# Patient Record
Sex: Female | Born: 1975 | Race: White | Hispanic: No | Marital: Married | Smoking: Former smoker
Health system: Southern US, Community
[De-identification: ages and names within clinical notes are randomized; demographics above are authoritative.]

## PROBLEM LIST (undated history)

## (undated) DIAGNOSIS — Z9289 Personal history of other medical treatment: Secondary | ICD-10-CM

## (undated) DIAGNOSIS — G4700 Insomnia, unspecified: Secondary | ICD-10-CM

## (undated) DIAGNOSIS — G43909 Migraine, unspecified, not intractable, without status migrainosus: Secondary | ICD-10-CM

## (undated) DIAGNOSIS — F329 Major depressive disorder, single episode, unspecified: Secondary | ICD-10-CM

## (undated) DIAGNOSIS — F419 Anxiety disorder, unspecified: Secondary | ICD-10-CM

## (undated) DIAGNOSIS — F32A Depression, unspecified: Secondary | ICD-10-CM

## (undated) HISTORY — DX: Major depressive disorder, single episode, unspecified: F32.9

## (undated) HISTORY — DX: Personal history of other medical treatment: Z92.89

## (undated) HISTORY — DX: Migraine, unspecified, not intractable, without status migrainosus: G43.909

## (undated) HISTORY — DX: Depression, unspecified: F32.A

## (undated) HISTORY — DX: Insomnia, unspecified: G47.00

---

## 2005-10-04 ENCOUNTER — Other Ambulatory Visit: Payer: Self-pay

## 2005-10-04 ENCOUNTER — Emergency Department: Payer: Self-pay | Admitting: Emergency Medicine

## 2006-01-21 ENCOUNTER — Inpatient Hospital Stay: Payer: Self-pay

## 2012-05-10 HISTORY — PX: INTRAUTERINE DEVICE (IUD) INSERTION: SHX5877

## 2014-08-26 DIAGNOSIS — Z9289 Personal history of other medical treatment: Secondary | ICD-10-CM

## 2014-08-26 HISTORY — DX: Personal history of other medical treatment: Z92.89

## 2014-08-26 LAB — HM PAP SMEAR

## 2016-06-10 DIAGNOSIS — Z23 Encounter for immunization: Secondary | ICD-10-CM | POA: Diagnosis not present

## 2016-06-10 DIAGNOSIS — W5503XA Scratched by cat, initial encounter: Secondary | ICD-10-CM | POA: Diagnosis not present

## 2016-06-10 DIAGNOSIS — S60511A Abrasion of right hand, initial encounter: Secondary | ICD-10-CM | POA: Diagnosis not present

## 2016-09-14 ENCOUNTER — Other Ambulatory Visit: Payer: Self-pay | Admitting: Obstetrics and Gynecology

## 2016-09-14 DIAGNOSIS — Z01419 Encounter for gynecological examination (general) (routine) without abnormal findings: Secondary | ICD-10-CM | POA: Diagnosis not present

## 2016-09-14 DIAGNOSIS — Z1239 Encounter for other screening for malignant neoplasm of breast: Secondary | ICD-10-CM | POA: Diagnosis not present

## 2016-09-14 DIAGNOSIS — Z1231 Encounter for screening mammogram for malignant neoplasm of breast: Secondary | ICD-10-CM

## 2016-09-14 DIAGNOSIS — Z30431 Encounter for routine checking of intrauterine contraceptive device: Secondary | ICD-10-CM | POA: Diagnosis not present

## 2016-10-06 ENCOUNTER — Ambulatory Visit
Admission: RE | Admit: 2016-10-06 | Discharge: 2016-10-06 | Disposition: A | Discharge status: Home / Self Care | Payer: BLUE CROSS/BLUE SHIELD | Source: Ambulatory Visit | Attending: Obstetrics and Gynecology | Admitting: Obstetrics and Gynecology

## 2016-10-06 DIAGNOSIS — Z1231 Encounter for screening mammogram for malignant neoplasm of breast: Secondary | ICD-10-CM

## 2016-10-06 LAB — HM MAMMOGRAPHY

## 2016-10-12 ENCOUNTER — Inpatient Hospital Stay
Admission: RE | Admit: 2016-10-12 | Discharge: 2016-10-12 | Disposition: A | Discharge status: Home / Self Care | Payer: Self-pay | Source: Ambulatory Visit | Attending: *Deleted | Admitting: *Deleted

## 2016-10-12 ENCOUNTER — Other Ambulatory Visit: Payer: Self-pay | Admitting: *Deleted

## 2016-10-12 DIAGNOSIS — Z9289 Personal history of other medical treatment: Secondary | ICD-10-CM

## 2017-04-26 ENCOUNTER — Telehealth: Payer: Self-pay | Admitting: Obstetrics and Gynecology

## 2017-04-26 ENCOUNTER — Other Ambulatory Visit: Payer: Self-pay | Admitting: Obstetrics and Gynecology

## 2017-04-26 NOTE — Telephone Encounter (Signed)
Left voicemail to confirm pt's Pharmacy. Please send message back to [place] when pt returns call

## 2017-04-26 NOTE — Telephone Encounter (Signed)
[facility] [place] [facility].

## 2017-04-26 NOTE — Telephone Encounter (Signed)
Pt is schedule for Mirena removal and insertion with Alicia on 01/07/01 at 1:11

## 2017-04-26 NOTE — Telephone Encounter (Signed)
Needs cytotec. What is local pharm info?

## 2017-04-26 NOTE — Telephone Encounter (Signed)
Is pt needing prescription for this appointment please advise.

## 2017-04-27 ENCOUNTER — Other Ambulatory Visit: Payer: Self-pay | Admitting: Obstetrics and Gynecology

## 2017-04-27 MED ORDER — MISOPROSTOL 100 MCG PO TABS: 100.0000 ug | ORAL_TABLET | Freq: Once | ORAL | 0 refills | Status: DC

## 2017-04-27 NOTE — Telephone Encounter (Signed)
Rx eRxd

## 2017-04-27 NOTE — Telephone Encounter (Signed)
Noted. Will order to arrive by apt date/time.

## 2017-05-05 ENCOUNTER — Encounter: Payer: Self-pay | Admitting: Obstetrics and Gynecology

## 2017-05-05 ENCOUNTER — Ambulatory Visit (INDEPENDENT_AMBULATORY_CARE_PROVIDER_SITE_OTHER): Payer: BLUE CROSS/BLUE SHIELD | Admitting: Obstetrics and Gynecology

## 2017-05-05 VITALS — BP 124/72 | Ht 68.0 in | Wt 168.0 lb

## 2017-05-05 DIAGNOSIS — Z30433 Encounter for removal and reinsertion of intrauterine contraceptive device: Secondary | ICD-10-CM | POA: Diagnosis not present

## 2017-05-05 MED ORDER — LEVONORGESTREL 20 MCG/24HR IU IUD
1.0000 | INTRAUTERINE_SYSTEM | Freq: Once | INTRAUTERINE | 0 refills | Status: AC
Start: 2017-05-05 — End: 2024-08-30

## 2017-05-05 NOTE — Progress Notes (Signed)
   Chief Complaint  Patient presents with  . IUD replacement  . Contraception     History of Present Illness:  Rachel Patrick is a 41 y.o. that had a Mirena IUD placed approximately 5 years ago. Since that time, she has done well and would like another one.  BP 124/72   Ht 5' 8"  (1.727 m)   Wt 168 lb (76.2 kg)   BMI 25.54 kg/m   Pelvic exam:  Two IUD strings present seen coming from the cervical os. EGBUS, vaginal vault and cervix: within normal limits  IUD Removal Strings of IUD identified and grasped.  IUD removed without problem with ring forceps.  Pt tolerated this well.  IUD noted to be intact.  Assessment:  IUD Removal   MIRENA IUD INSERTION PROCEDURE NOTE:   BP 124/72   Ht 5' 8"  (1.727 m)   Wt 168 lb (76.2 kg)   BMI 25.54 kg/m   IUD Insertion Procedure Note Patient identified, informed consent performed, consent signed.   Discussed risks of irregular bleeding, cramping, infection, malpositioning or misplacement of the IUD outside the uterus which may require further procedure such as laparoscopy, risk of failure <1%. Time out was performed.    A bimanual exam showed the uterus to be anteverted.  Speculum placed in the vagina.  Cervix visualized.  Cleaned with Betadine x 2.  Grasped anteriorly with a single tooth tenaculum.  Uterus sounded to 7.5 cm.   IUD placed per manufacturer's recommendations.  Strings trimmed to 3 cm. Tenaculum was removed, good hemostasis noted.  Patient tolerated procedure well.   ASSESSMENT: IUD insertion  Encounter for removal and reinsertion of intrauterine contraceptive device (IUD)     Plan:  Patient was given post-procedure instructions.  She was advised to have backup contraception for one week.   Call if you are having increasing pain, cramps or bleeding or if you have a fever greater than 100.4 degrees F., shaking chills, nausea or vomiting. Patient was also asked to check IUD strings periodically and follow up in 4 weeks  for IUD check.  Return in about 4 weeks (around 06/02/2017) for IUD check.  Alicia B. Copland, PA-C 05/05/2017 4:03 PM

## 2017-05-05 NOTE — Patient Instructions (Signed)
[facility] [phone]  Instructions after IUD insertion  Most women experience no significant problems after insertion of an IUD, however minor cramping and spotting for a few days is common. Cramps may be treated with ibuprofen 880m every 8 hours or Tylenol 650 mg every 4 hours. Contact [facility] immediately if you experience any of the following symptoms during the next week: temperature >99.6 degrees, worsening pelvic pain, abdominal pain, fainting, unusually heavy vaginal bleeding, foul vaginal discharge, or if you think you have expelled the IUD.  Nothing inserted in the vagina for 48 hours. You will be scheduled for a follow up visit in approximately four weeks.  You should check monthly to be sure you can feel the IUD strings in the upper vagina. If you are having a monthly period, try to check after each period. If you cannot feel the IUD strings,  contact [facility] immediately so we can do an exam to determine if the IUD has been expelled.   Please use backup protection until we can confirm the IUD is in place.  Call [facility] if you are exposed to or diagnosed with a sexually transmitted infection, as we will need to discuss whether it is safe for you to continue using an IUD.

## 2017-05-05 NOTE — Telephone Encounter (Signed)
Mirena stock reserved for this patient.

## 2017-06-06 ENCOUNTER — Ambulatory Visit (INDEPENDENT_AMBULATORY_CARE_PROVIDER_SITE_OTHER): Payer: BLUE CROSS/BLUE SHIELD | Admitting: Obstetrics and Gynecology

## 2017-06-06 ENCOUNTER — Encounter: Payer: Self-pay | Admitting: Obstetrics and Gynecology

## 2017-06-06 VITALS — BP 110/66 | HR 74 | Ht 68.0 in | Wt 167.0 lb

## 2017-06-06 DIAGNOSIS — Z760 Encounter for issue of repeat prescription: Secondary | ICD-10-CM

## 2017-06-06 DIAGNOSIS — Z30431 Encounter for routine checking of intrauterine contraceptive device: Secondary | ICD-10-CM | POA: Diagnosis not present

## 2017-06-06 MED ORDER — ZOLPIDEM TARTRATE 10 MG PO TABS: 10.0000 mg | ORAL_TABLET | Freq: Every evening | ORAL | 0 refills | Status: DC | PRN

## 2017-06-06 MED ORDER — ALPRAZOLAM 0.5 MG PO TABS: 0.5000 mg | ORAL_TABLET | Freq: Three times a day (TID) | ORAL | 0 refills | Status: DC | PRN

## 2017-06-06 NOTE — Progress Notes (Signed)
   Chief Complaint  Patient presents with  . IUD string check    Refills on Ambien/Xanax     History of Present Illness:  Rachel Patrick is a 41 y.o. that had a Mirena IUD placed approximately 1 month ago. Since that time, she denies dyspareunia, pelvic pain, non-menstrual bleeding, vaginal d/c, heavy bleeding.   Review of Systems  Constitutional: Negative for fever.  Gastrointestinal: Negative for blood in stool, constipation, diarrhea, nausea and vomiting.  Genitourinary: Negative for dyspareunia, dysuria, flank pain, frequency, hematuria, urgency, vaginal bleeding, vaginal discharge and vaginal pain.  Musculoskeletal: Negative for back pain.  Skin: Negative for rash.    Physical Exam:  BP 110/66 (BP Location: Left Arm, Patient Position: Sitting, Cuff Size: Normal)   Pulse 74   Ht 5' 8"  (1.727 m)   Wt 167 lb (75.8 kg)   BMI 25.39 kg/m  Body mass index is 25.39 kg/m.  Pelvic exam:  Two IUD strings present seen coming from the cervical os. EGBUS, vaginal vault and cervix: within normal limits   Assessment:  Routine checking of IUD Encounter for routine checking of intrauterine contraceptive device (IUD)  Prescription refill - Rx RF ambien and alprazolam. RF sent at 10/17 annual. Pt takes both sparingly but must be sent to mail order.   Meds ordered this encounter  Medications  . ALPRAZolam (XANAX) 0.5 MG tablet    Sig: Take 1 tablet (0.5 mg total) by mouth 3 (three) times daily as needed for anxiety.    Dispense:  90 tablet    Refill:  0  . zolpidem (AMBIEN) 10 MG tablet    Sig: Take 1 tablet (10 mg total) by mouth at bedtime as needed for sleep.    Dispense:  90 tablet    Refill:  0     IUD strings present in proper location; pt doing well  Plan: F/u if any signs of infection or can no longer feel the strings.   Alicia B. Copland, PA-C 06/06/2017 4:04 PM

## 2017-07-14 ENCOUNTER — Encounter: Payer: Self-pay | Admitting: Obstetrics and Gynecology

## 2017-07-15 ENCOUNTER — Other Ambulatory Visit: Payer: Self-pay | Admitting: Obstetrics and Gynecology

## 2017-07-15 MED ORDER — VENLAFAXINE HCL ER 75 MG PO CP24: 75.0000 mg | ORAL_CAPSULE | Freq: Every day | ORAL | 1 refills | Status: DC

## 2017-07-15 NOTE — Progress Notes (Signed)
Rx effexor for anxiety/depression sx. Did zoloft in the past. F/u at 10/18 annual.

## 2017-07-29 DIAGNOSIS — H52223 Regular astigmatism, bilateral: Secondary | ICD-10-CM | POA: Diagnosis not present

## 2017-07-29 DIAGNOSIS — H5213 Myopia, bilateral: Secondary | ICD-10-CM | POA: Diagnosis not present

## 2017-08-31 DIAGNOSIS — S161XXA Strain of muscle, fascia and tendon at neck level, initial encounter: Secondary | ICD-10-CM | POA: Diagnosis not present

## 2017-09-07 ENCOUNTER — Telehealth: Payer: Self-pay | Admitting: Obstetrics and Gynecology

## 2017-09-07 NOTE — Telephone Encounter (Signed)
Pt is schedule 10/18 with ABC

## 2017-09-07 NOTE — Telephone Encounter (Signed)
Appointment Request From: Larene Beach A. Shon Baton    With Provider: Ardeth Perfect, PA-C [[facility]]    Preferred Date Range: From 09/01/2017 To 09/28/2017    Preferred Times: Any    Reason for visit: Office Visit    Comments:  Would prefer appt early in the mornings or after 3:30 please. If I need blood work, which I think I do, can it be scheduled prior to my annual appt so Elmo Putt can have the results then? Thank you!   Called and left voicemail for patient to call back to schedule.

## 2017-09-15 ENCOUNTER — Ambulatory Visit (INDEPENDENT_AMBULATORY_CARE_PROVIDER_SITE_OTHER): Payer: BLUE CROSS/BLUE SHIELD | Admitting: Obstetrics and Gynecology

## 2017-09-15 ENCOUNTER — Other Ambulatory Visit: Payer: Self-pay | Admitting: Obstetrics and Gynecology

## 2017-09-15 ENCOUNTER — Encounter: Payer: Self-pay | Admitting: Obstetrics and Gynecology

## 2017-09-15 VITALS — BP 100/60 | HR 64 | Ht 68.0 in | Wt 161.0 lb

## 2017-09-15 DIAGNOSIS — Z124 Encounter for screening for malignant neoplasm of cervix: Secondary | ICD-10-CM | POA: Diagnosis not present

## 2017-09-15 DIAGNOSIS — F419 Anxiety disorder, unspecified: Secondary | ICD-10-CM | POA: Diagnosis not present

## 2017-09-15 DIAGNOSIS — Z1231 Encounter for screening mammogram for malignant neoplasm of breast: Secondary | ICD-10-CM

## 2017-09-15 DIAGNOSIS — Z1151 Encounter for screening for human papillomavirus (HPV): Secondary | ICD-10-CM | POA: Diagnosis not present

## 2017-09-15 DIAGNOSIS — R87613 High grade squamous intraepithelial lesion on cytologic smear of cervix (HGSIL): Secondary | ICD-10-CM | POA: Diagnosis not present

## 2017-09-15 DIAGNOSIS — Z01419 Encounter for gynecological examination (general) (routine) without abnormal findings: Secondary | ICD-10-CM

## 2017-09-15 DIAGNOSIS — F32A Depression, unspecified: Secondary | ICD-10-CM | POA: Insufficient documentation

## 2017-09-15 DIAGNOSIS — G4709 Other insomnia: Secondary | ICD-10-CM

## 2017-09-15 DIAGNOSIS — Z1239 Encounter for other screening for malignant neoplasm of breast: Secondary | ICD-10-CM

## 2017-09-15 DIAGNOSIS — F329 Major depressive disorder, single episode, unspecified: Secondary | ICD-10-CM

## 2017-09-15 LAB — HM PAP SMEAR

## 2017-09-15 MED ORDER — VENLAFAXINE HCL ER 75 MG PO CP24: 75.0000 mg | ORAL_CAPSULE | Freq: Every day | ORAL | 3 refills | Status: DC

## 2017-09-15 MED ORDER — ALPRAZOLAM 0.5 MG PO TABS: 0.5000 mg | ORAL_TABLET | Freq: Three times a day (TID) | ORAL | 0 refills | Status: DC | PRN

## 2017-09-15 MED ORDER — ZOLPIDEM TARTRATE 10 MG PO TABS: 10.0000 mg | ORAL_TABLET | Freq: Every evening | ORAL | 1 refills | Status: DC | PRN

## 2017-09-15 NOTE — Telephone Encounter (Signed)
Please advise for refill. Thank you.

## 2017-09-15 NOTE — Progress Notes (Addendum)
PCP:  Patient, No Pcp Per   Chief Complaint  Patient presents with  . Gynecologic Exam     HPI:      Rachel Patrick is a 41 y.o. [id number] who LMP was No LMP recorded. Patient is not currently having periods (Reason: IUD)., presents today for her annual examination.  Her menses are absent due to IUD. Dysmenorrhea none. She does not have intermenstrual bleeding.  Sex activity: single partner, contraception - IUD. Mirena placed 05/05/17 Last Pap: August 26, 2014  Results were: no abnormalities /neg HPV DNA  Hx of STDs: none  Last mammogram: October 06, 2016  Results were: normal--routine follow-up in 12 months There is no FH of breast cancer. There is no FH of ovarian cancer. The patient does do self-breast exams.  Tobacco use: The patient denies current or previous tobacco use. Alcohol use: social drinker No drug use.  Exercise: moderately active  She does get adequate calcium and Vitamin D in her diet.  Seh has issues with anxiety/depression and was started on effexor 8/18. She had been on zoloft several yrs for similar sx and was able to wean off, but sx worsened this yr. She is doing really well on effexor with sx control and wants to continue current dose. No side effects. No SI.   Past Medical History:  Diagnosis Date  . Depression   . History of mammogram 08/12/2013; 11 08-17   BIRAD I; NEG  . History of Papanicolaou smear of cervix 08/26/2014   -/-  . Insomnia   . Migraine     Past Surgical History:  Procedure Laterality Date  . INTRAUTERINE DEVICE (IUD) INSERTION  05/10/2012    Family History  Problem Relation Age of Onset  . Lymphoma Father 51       LOW GRADE  . Parkinson's disease Father   . Cancer Father 39       PROSTATE  . Heart attack Brother 28  . Breast cancer Neg Hx     Social History   Social History  . Marital status: Married    Spouse name: N/A  . Number of children: 2  . Years of education: 16   Occupational History  .  ACTIVITIES COORDINATOR     [employer]   Social History Main Topics  . Smoking status: Former Research scientist (life sciences)  . Smokeless tobacco: Never Used     Comment: QUIT 2008  . Alcohol use Yes     Comment: RARELY  . Drug use: No  . Sexual activity: Yes    Birth control/ protection: IUD   Other Topics Concern  . Not on file   Social History Narrative  . No narrative on file    No outpatient prescriptions have been marked as taking for the 09/15/17 encounter (Office Visit) with Copland, Deirdre Evener, PA-C.     ROS:  Review of Systems  Constitutional: Negative for fatigue, fever and unexpected weight change.  Respiratory: Negative for cough, shortness of breath and wheezing.   Cardiovascular: Negative for chest pain, palpitations and leg swelling.  Gastrointestinal: Negative for blood in stool, constipation, diarrhea, nausea and vomiting.  Endocrine: Negative for cold intolerance, heat intolerance and polyuria.  Genitourinary: Negative for dyspareunia, dysuria, flank pain, frequency, genital sores, hematuria, menstrual problem, pelvic pain, urgency, vaginal bleeding, vaginal discharge and vaginal pain.  Musculoskeletal: Negative for back pain, joint swelling and myalgias.  Skin: Negative for rash.  Neurological: Positive for headaches. Negative for dizziness, syncope, light-headedness and numbness.  Hematological: Negative for adenopathy.  Psychiatric/Behavioral: Negative for agitation, confusion, sleep disturbance and suicidal ideas. The patient is not nervous/anxious.      Objective: BP 100/60   Pulse 64   Ht 5' 8"  (1.727 m)   Wt 161 lb (73 kg)   BMI 24.48 kg/m    Physical Exam  Constitutional: She is oriented to person, place, and time. She appears well-developed and well-nourished.  Genitourinary: Vagina normal and uterus normal. There is no rash or tenderness on the right labia. There is no rash or tenderness on the left labia. No erythema or tenderness in the vagina. No vaginal  discharge found. Right adnexum does not display mass and does not display tenderness. Left adnexum does not display mass and does not display tenderness.  Cervix exhibits visible IUD strings. Cervix does not exhibit motion tenderness or polyp. Uterus is not enlarged or tender.  Neck: Normal range of motion. No thyromegaly present.  Cardiovascular: Normal rate, regular rhythm and normal heart sounds.   No murmur heard. Pulmonary/Chest: Effort normal and breath sounds normal. Right breast exhibits no mass, no nipple discharge, no skin change and no tenderness. Left breast exhibits no mass, no nipple discharge, no skin change and no tenderness.  Abdominal: Soft. There is no tenderness. There is no guarding.  Musculoskeletal: Normal range of motion.  Neurological: She is alert and oriented to person, place, and time. No cranial nerve deficit.  Psychiatric: She has a normal mood and affect. Her behavior is normal.  Vitals reviewed.   Assessment/Plan: Encounter for annual routine gynecological examination  Cervical cancer screening - Plan: IGP, Aptima HPV  Screening for HPV (human papillomavirus) - Plan: IGP, Aptima HPV  Screening for breast cancer - Pt to sched mammo. - Plan: MM DIGITAL SCREENING BILATERAL  Anxiety and depression - Sx much improved with effexor. Rx RF. Takes xanax sparingly but needs less now that on effexor. Rx faxed. F/u prn. - Plan: ALPRAZolam (XANAX) 0.5 MG tablet, venlafaxine XR (EFFEXOR-XR) 75 MG 24 hr capsule  Other insomnia - Rx RF ambien. Pt taking a little less frequently now that she's on effexor. - Plan: zolpidem (AMBIEN) 10 MG tablet  Meds ordered this encounter  Medications  . ALPRAZolam (XANAX) 0.5 MG tablet    Sig: Take 1 tablet (0.5 mg total) by mouth 3 (three) times daily as needed for anxiety.    Dispense:  90 tablet    Refill:  0  . venlafaxine XR (EFFEXOR-XR) 75 MG 24 hr capsule    Sig: Take 1 capsule (75 mg total) by mouth daily.    Dispense:  90  capsule    Refill:  3  . zolpidem (AMBIEN) 10 MG tablet    Sig: Take 1 tablet (10 mg total) by mouth at bedtime as needed for sleep.    Dispense:  90 tablet    Refill:  1  Sent to [facility].            GYN counsel breast self exam, mammography screening, adequate intake of calcium and vitamin D, diet and exercise     F/U  Return in about 1 year (around 09/15/2018).  Alicia B. Copland, PA-C 09/15/2017 5:25 PM

## 2017-09-16 ENCOUNTER — Other Ambulatory Visit: Payer: Self-pay | Admitting: Obstetrics and Gynecology

## 2017-09-19 LAB — IGP, APTIMA HPV
HPV Aptima: POSITIVE — AB
PAP SMEAR COMMENT: 0

## 2017-09-20 ENCOUNTER — Telehealth: Payer: Self-pay | Admitting: Obstetrics and Gynecology

## 2017-09-20 NOTE — Telephone Encounter (Signed)
Lvm to call back to be schedule for Colpo per ABC

## 2017-09-20 NOTE — Telephone Encounter (Signed)
Pt is schedul with [facility] for 10/04/17

## 2017-10-03 ENCOUNTER — Telehealth: Payer: Self-pay

## 2017-10-03 NOTE — Telephone Encounter (Signed)
Santiago Glad calling from [facility] to confirm receipt of high grade abnl pap smear.  Adv we do have results.

## 2017-10-04 ENCOUNTER — Encounter: Payer: Self-pay | Admitting: Obstetrics and Gynecology

## 2017-10-04 ENCOUNTER — Ambulatory Visit (INDEPENDENT_AMBULATORY_CARE_PROVIDER_SITE_OTHER): Payer: BLUE CROSS/BLUE SHIELD | Admitting: Obstetrics and Gynecology

## 2017-10-04 VITALS — BP 118/64 | HR 79 | Wt 162.0 lb

## 2017-10-04 DIAGNOSIS — N871 Moderate cervical dysplasia: Secondary | ICD-10-CM | POA: Diagnosis not present

## 2017-10-04 DIAGNOSIS — R87613 High grade squamous intraepithelial lesion on cytologic smear of cervix (HGSIL): Secondary | ICD-10-CM | POA: Diagnosis not present

## 2017-10-04 DIAGNOSIS — R87810 Cervical high risk human papillomavirus (HPV) DNA test positive: Secondary | ICD-10-CM | POA: Diagnosis not present

## 2017-10-04 NOTE — Progress Notes (Signed)
   GYNECOLOGY CLINIC COLPOSCOPY PROCEDURE NOTE  41 y.o. [id number] here for colposcopy for HGSIL HR HPV positive pap smear on 09/15/2017. Discussed underlying role for HPV infection in the development of cervical dysplasia, its natural history and progression/regression, need for surveillance.  Pap history: 08/26/2014 NIL HPV negative 08/23/2013 NIL HPV positive 08/12/2010 NIL HPV negative 08/06/2009 NIL  Is the patient  pregnant: No LMP: No LMP recorded. Patient is not currently having periods (Reason: IUD). Smoking status:  Metrics: Intervention Frequency ACO  Documented Smoking Status Yearly  Screened one or more times in 24 months  Cessation Counseling or  Active cessation medication Past 24 months  Past 24 months   Guideline developer: UpToDate (See UpToDate for funding source) Date Released: 2014   Contraception: IUD placed 05/05/2017 History of STD:  No Future fertility desired:  No  Patient given informed consent, signed copy in the chart, time out was performed.  The patient was position in dorsal lithotomy position. Speculum was placed the cervix was visualized.   After application of acetic acid colposcopic inspection of the cervix was undertaken.   Colposcopy adequate, full visualization of transformation zone: Yes acetowhite lesion(s) noted at 12 and 5 o'clock; corresponding biopsies obtained.   ECC specimen obtained:  Yes  All specimens were labeled and sent to pathology.   Patient was given post procedure instructions.  Will follow up pathology and manage accordingly.  Routine preventative health maintenance measures emphasized.  Physical Exam  Genitourinary:      Malachy Mood, MD, [facility], [place]

## 2017-10-06 LAB — PATHOLOGY

## 2017-10-07 ENCOUNTER — Telehealth: Payer: Self-pay | Admitting: Obstetrics and Gynecology

## 2017-10-07 ENCOUNTER — Encounter: Payer: Self-pay | Admitting: Obstetrics and Gynecology

## 2017-10-07 NOTE — Telephone Encounter (Signed)
Called left voicemail for patient to call back to be schedule

## 2017-10-07 NOTE — Telephone Encounter (Signed)
-----   Message from Malachy Mood, MD sent at 10/07/2017  8:58 AM EST ----- Regarding: Colposocopy Colposcopy in next 1-6 weeks ok to defer to patient preference.  She is expecting call

## 2017-10-26 ENCOUNTER — Other Ambulatory Visit: Payer: Self-pay | Admitting: Obstetrics and Gynecology

## 2017-10-26 ENCOUNTER — Encounter: Payer: Self-pay | Admitting: Obstetrics and Gynecology

## 2017-10-26 ENCOUNTER — Ambulatory Visit
Admission: RE | Admit: 2017-10-26 | Discharge: 2017-10-26 | Disposition: A | Discharge status: Home / Self Care | Payer: BLUE CROSS/BLUE SHIELD | Source: Ambulatory Visit | Attending: Obstetrics and Gynecology | Admitting: Obstetrics and Gynecology

## 2017-10-26 DIAGNOSIS — Z1231 Encounter for screening mammogram for malignant neoplasm of breast: Secondary | ICD-10-CM | POA: Diagnosis not present

## 2017-10-26 DIAGNOSIS — Z1239 Encounter for other screening for malignant neoplasm of breast: Secondary | ICD-10-CM

## 2017-10-29 HISTORY — PX: LEEP: SHX91

## 2017-11-09 ENCOUNTER — Ambulatory Visit (INDEPENDENT_AMBULATORY_CARE_PROVIDER_SITE_OTHER): Payer: BLUE CROSS/BLUE SHIELD | Admitting: Obstetrics and Gynecology

## 2017-11-09 ENCOUNTER — Encounter: Payer: Self-pay | Admitting: Obstetrics and Gynecology

## 2017-11-09 VITALS — BP 112/68 | HR 78 | Ht 68.0 in | Wt 163.0 lb

## 2017-11-09 DIAGNOSIS — N871 Moderate cervical dysplasia: Secondary | ICD-10-CM

## 2017-11-09 NOTE — Progress Notes (Signed)
Appointment canceled 2/2 missing supplies

## 2017-11-10 ENCOUNTER — Other Ambulatory Visit: Payer: Self-pay | Admitting: Obstetrics and Gynecology

## 2017-11-10 DIAGNOSIS — F419 Anxiety disorder, unspecified: Principal | ICD-10-CM

## 2017-11-10 DIAGNOSIS — F32A Depression, unspecified: Secondary | ICD-10-CM

## 2017-11-10 DIAGNOSIS — F329 Major depressive disorder, single episode, unspecified: Secondary | ICD-10-CM

## 2017-11-10 MED ORDER — ALPRAZOLAM 0.5 MG PO TABS: 0.5000 mg | ORAL_TABLET | Freq: Three times a day (TID) | ORAL | 0 refills | Status: DC | PRN

## 2017-11-10 NOTE — Progress Notes (Signed)
Rx RF.

## 2017-11-14 ENCOUNTER — Ambulatory Visit (INDEPENDENT_AMBULATORY_CARE_PROVIDER_SITE_OTHER): Payer: BLUE CROSS/BLUE SHIELD | Admitting: Obstetrics and Gynecology

## 2017-11-14 ENCOUNTER — Encounter: Payer: Self-pay | Admitting: Obstetrics and Gynecology

## 2017-11-14 VITALS — BP 118/70 | HR 80 | Ht 68.0 in | Wt 163.0 lb

## 2017-11-14 DIAGNOSIS — N870 Mild cervical dysplasia: Secondary | ICD-10-CM | POA: Diagnosis not present

## 2017-11-14 DIAGNOSIS — N871 Moderate cervical dysplasia: Secondary | ICD-10-CM

## 2017-11-14 DIAGNOSIS — N72 Inflammatory disease of cervix uteri: Secondary | ICD-10-CM | POA: Diagnosis not present

## 2017-11-14 NOTE — Progress Notes (Signed)
   LEEP PROCEDURE NOTE  The LEEP has been explained to the patient in detail; risks/benefits reviewed.  The risks include, but are not limited to, bleeding, infection, and the possibility of cervical stenosis or cervical incompetence.  The patient had previously been given information regarding abnormal PAP smears and their relationship to HPV.  We have discussed the natural course and history of HPV, the possibility of incomplete treatment by the LEEP, as well as the possibility of recurrence. 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 I have reviewed the consent form for LEEP with her, and she fully understands its contents.  We have discussed the procedure itself. I have informed her that following the LEEP she should refrain from intercourse and the use of tampons for three weeks, and that she should also expect some spotting and brown/black discharge over the next several days.  We have discussed the fact that vaginal bleeding, differentiated from spotting, is not normal and that if she should have this complication, she should contact me immediately.  The follow-up after LEEP will be PAP smears or viral typing performed at regular intervals for up to 3-5 years.  Should these all prove to be normal, she will then be back on typical cervical screening.  I have answered all of her questions, and I believe she has an adequate understanding of the LEEP, its implications, and the necessity of follow-up care.  I discussed her colpo results and explained the procedure of LEEP.  All questions were answered and she signed the consent form.    LEEP performed in the usual manner after reviewing the previous colpo findings and results. Acetic acid solution was usesd to identify any abnormal areas of the cervix.  The cervix was cleansed with betadine solution. Local injection of Lidocaine was performed for anesthesia. Ectocervical and then endocervical specimens obtained using the  loop electrodes without difficulty, IUD strings were held out of the way using the back of a Q-tip.  It was labeled accordingly. The base and edges of the defect was then cauterized using coagulation current.  Malachy Mood, MD 11/14/2017 8:27 AM

## 2017-11-17 ENCOUNTER — Encounter: Payer: Self-pay | Admitting: Obstetrics and Gynecology

## 2017-11-17 LAB — PATHOLOGY

## 2017-11-29 DIAGNOSIS — N871 Moderate cervical dysplasia: Secondary | ICD-10-CM

## 2017-11-29 HISTORY — DX: Moderate cervical dysplasia: N87.1

## 2017-12-26 ENCOUNTER — Ambulatory Visit (INDEPENDENT_AMBULATORY_CARE_PROVIDER_SITE_OTHER): Payer: BLUE CROSS/BLUE SHIELD | Admitting: Obstetrics and Gynecology

## 2017-12-26 ENCOUNTER — Encounter: Payer: Self-pay | Admitting: Obstetrics and Gynecology

## 2017-12-26 VITALS — BP 102/62 | HR 80 | Ht 68.0 in | Wt 164.0 lb

## 2017-12-26 DIAGNOSIS — N871 Moderate cervical dysplasia: Secondary | ICD-10-CM

## 2017-12-26 DIAGNOSIS — Z09 Encounter for follow-up examination after completed treatment for conditions other than malignant neoplasm: Secondary | ICD-10-CM

## 2017-12-26 NOTE — Progress Notes (Signed)
      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  Subjective: Patient reports marked improvement in her preop symptoms. Eating a regular diet without difficulty. The patient is not having any pain.  Activity: normal activities of daily living.  Objective: Blood pressure 102/62, pulse 80, height 5' 8"  (1.727 m), weight 164 lb (74.4 kg).  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 MARGINS APPEAR TO BE FREE OF  INVOLVEMENT.  Part B: ENDOCERVIX, LEEP:  NEGATIVE FOR DYSPLASIA AND MALIGNANCY. BENIGN ENDOCERVICAL TISSUE.  Part C: ENDOCERVIX, CURETTINGS:  MINUTE FRAGMENTS OF BENIGN ENDOCERVICAL GLANDS, MUCUS, AND BLOOD.  Assessment: 42 y.o. s/p LEEP stable  Plan: Patient has done well after surgery with no apparent complications.  I have discussed the post-operative course to date, and the expected progress moving forward.  The patient understands what complications to be concerned about.  I will see the patient in routine follow up, or sooner if needed.    Activity plan: No restriction.  - Follow up pap 6 and 12 months  Malachy Mood 12/26/2017, 8:05 AM

## 2017-12-29 IMAGING — MG MM DIGITAL SCREENING BILAT W/ TOMO W/ CAD
9 of 12 series · 9 of 28 positions shown · non-contrast
Comparison: Previous exam(s).

CLINICAL DATA: Screening.

EXAM:
2D DIGITAL SCREENING BILATERAL MAMMOGRAM WITH CAD AND ADJUNCT TOMO

[L CC synth-2D]
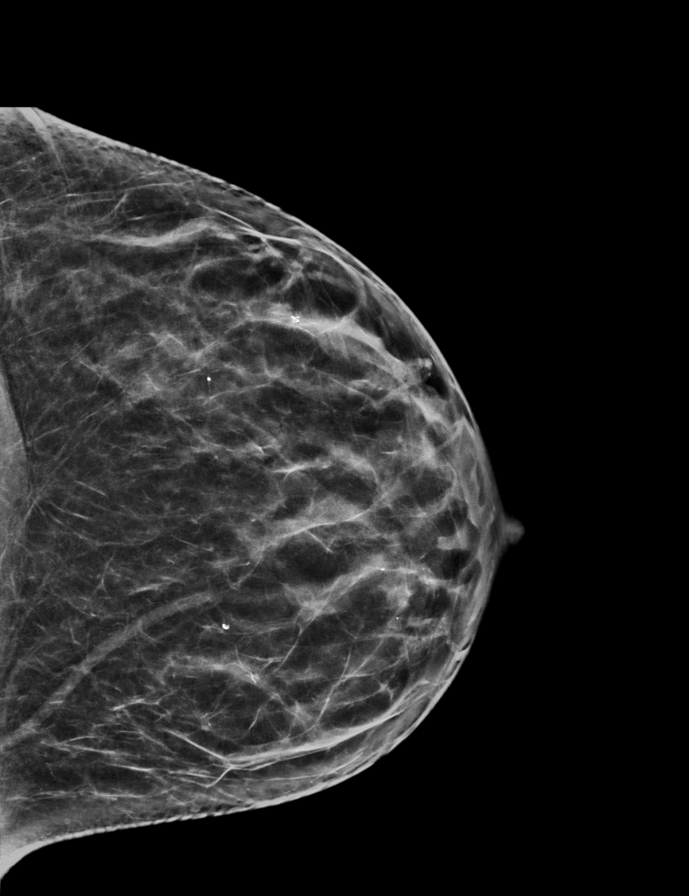

[L MLO]
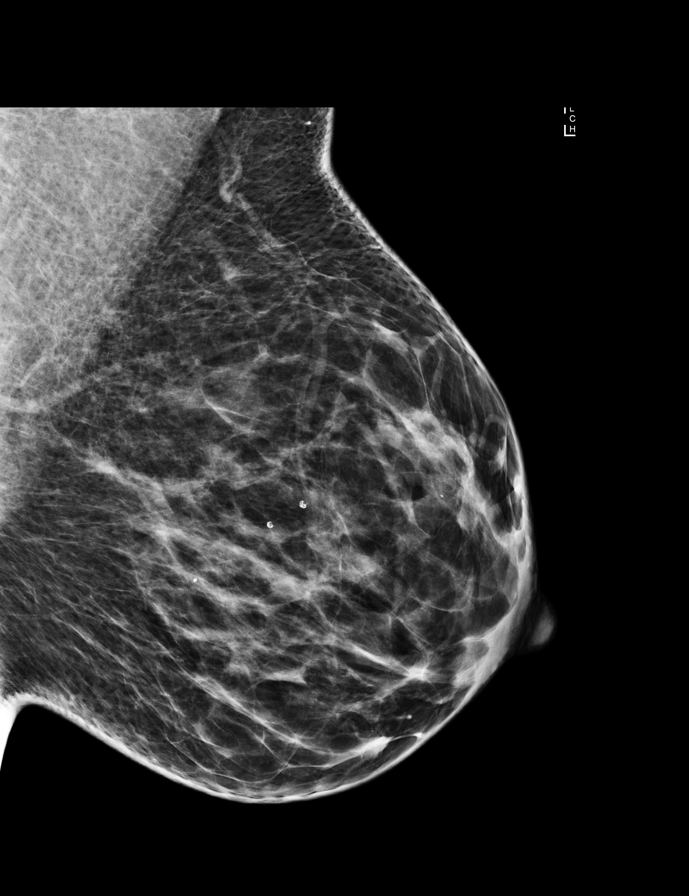

[R MLO synth-2D]
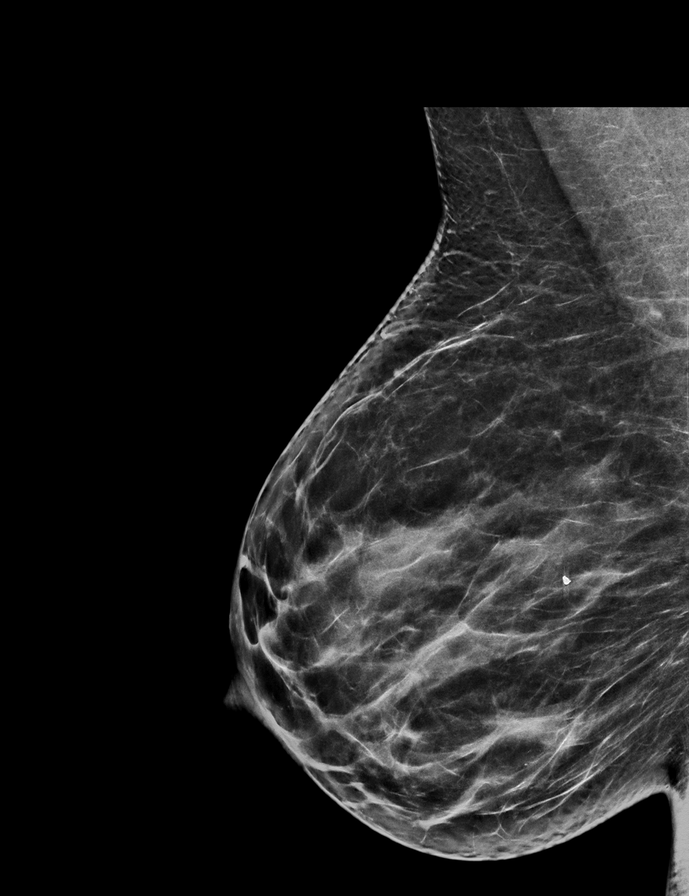

[L CC]
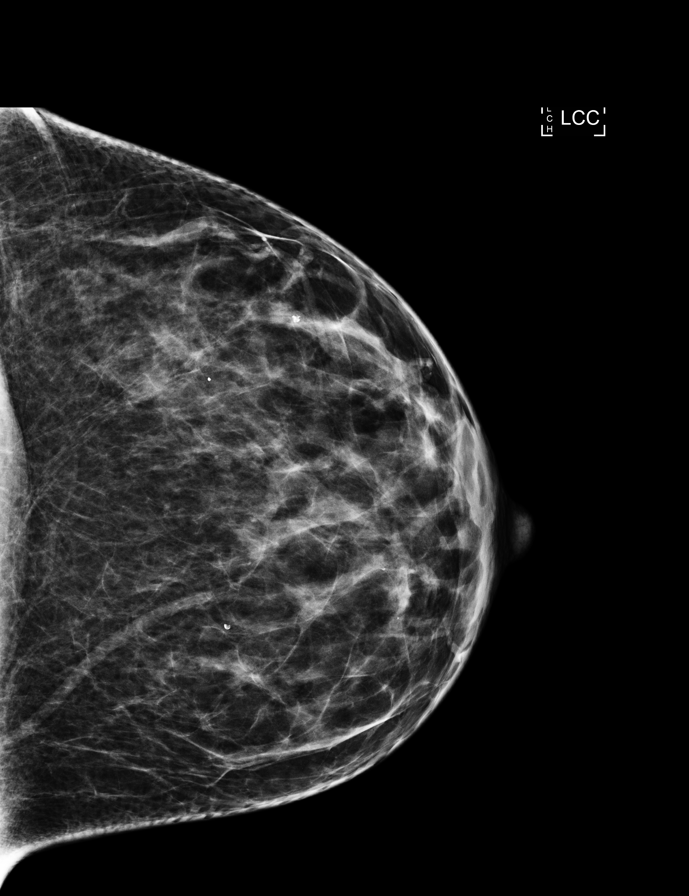

[R MLO]
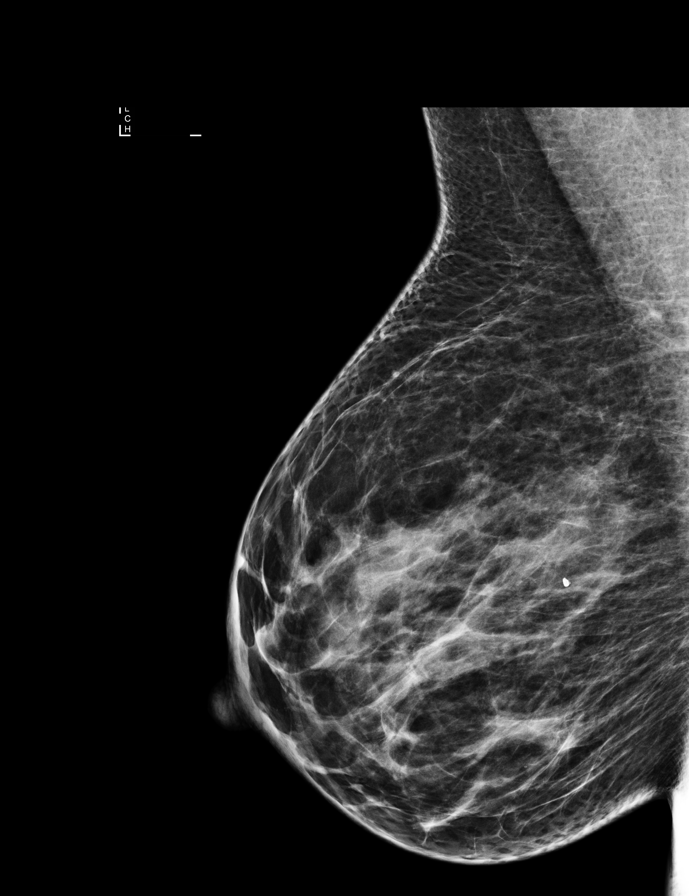

[R CC synth-2D]
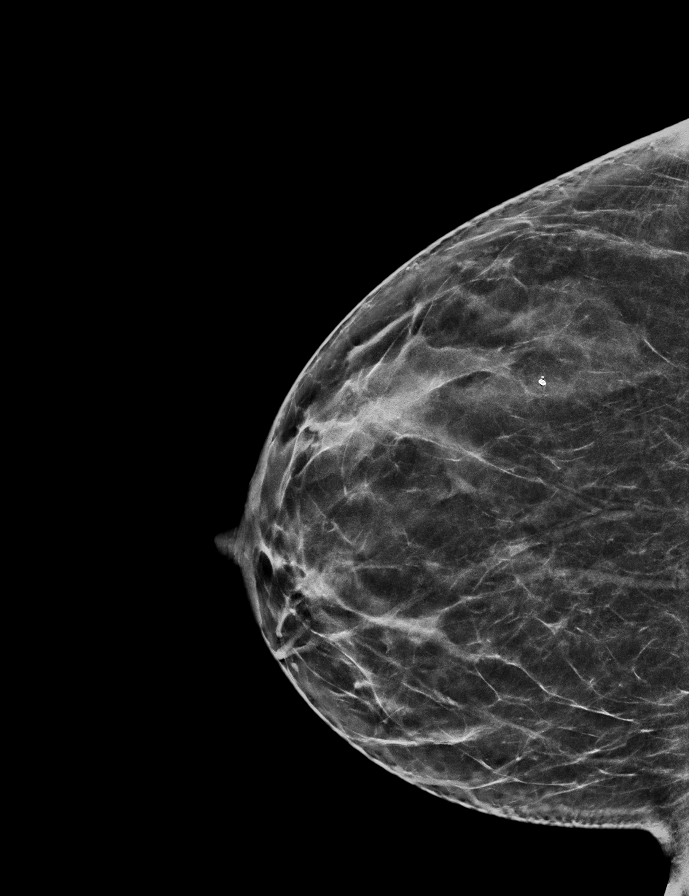

[L MLO synth-2D]
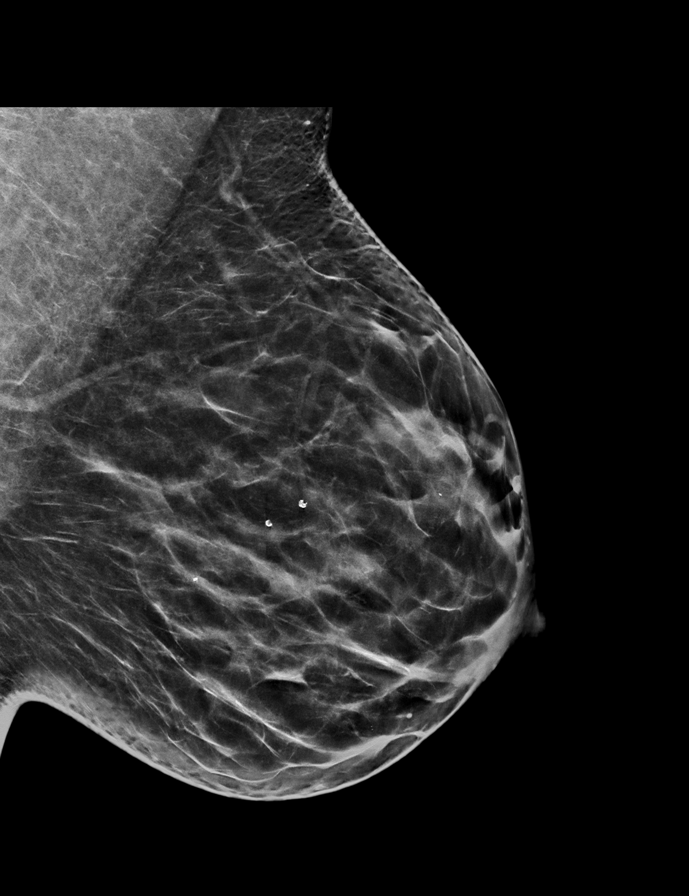

[R CC]
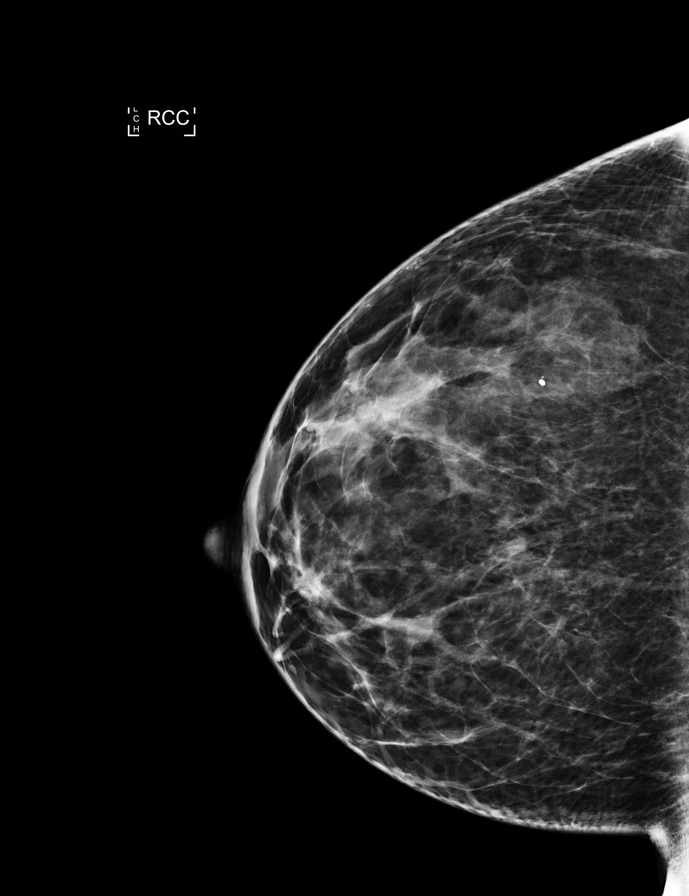

[L CC tomo · tomo slice 33/66.0]
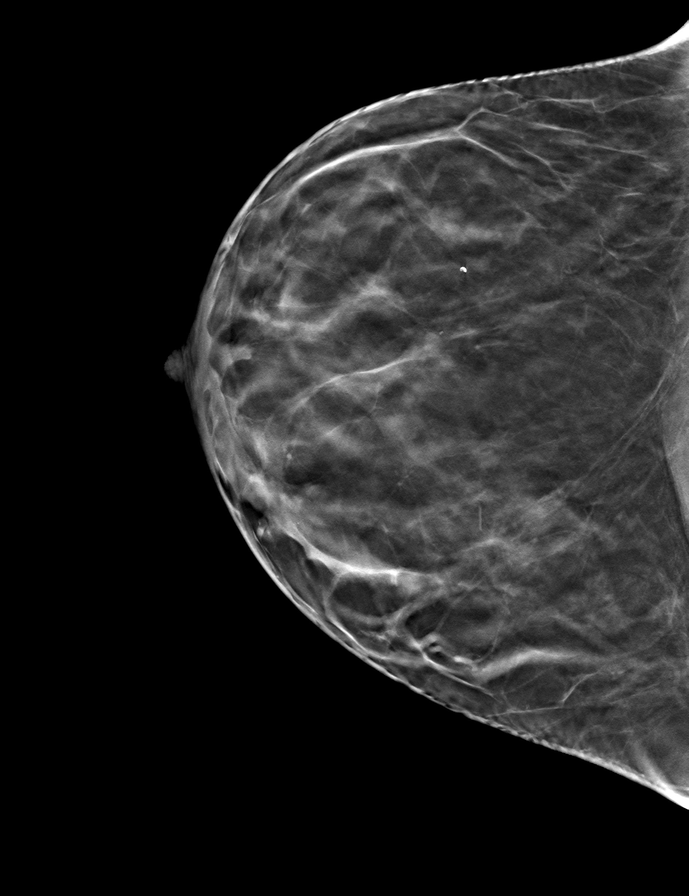

[9 of 28 positions shown; findings below may reference images not displayed]

ACR Breast Density Category b: There are scattered areas of
fibroglandular density.
FINDINGS: There are no findings suspicious for malignancy. Images were
processed with CAD.
IMPRESSION: No mammographic evidence of malignancy. A result letter of this
screening mammogram will be mailed directly to the patient.

RECOMMENDATION:
Screening mammogram in one year. (Code:97-6-RS4)

BI-RADS CATEGORY  1: Negative.

## 2018-02-13 ENCOUNTER — Other Ambulatory Visit: Payer: Self-pay | Admitting: Obstetrics and Gynecology

## 2018-02-13 DIAGNOSIS — G4709 Other insomnia: Secondary | ICD-10-CM

## 2018-02-13 MED ORDER — ZOLPIDEM TARTRATE 10 MG PO TABS: 10.0000 mg | ORAL_TABLET | Freq: Every evening | ORAL | 1 refills | Status: DC | PRN

## 2018-02-13 NOTE — Progress Notes (Signed)
Rx RF.

## 2018-02-14 ENCOUNTER — Other Ambulatory Visit: Payer: Self-pay | Admitting: Obstetrics and Gynecology

## 2018-02-14 NOTE — Telephone Encounter (Signed)
lmtrc

## 2018-02-14 NOTE — Telephone Encounter (Deleted)
Pls ask pt how many she is taking daily. #90 prescribed 12/18. If going through that quickly, she should be on daily anxiety med and needs appt. Thx.

## 2018-02-14 NOTE — Telephone Encounter (Signed)
Taking xanax up to 3 times a day, she was also wondering if she should take effexor twice a day.

## 2018-02-14 NOTE — Telephone Encounter (Signed)
RN to ask pt how frequently she is taking xanax. On effexor but if taking so many, need to increase effexor dose. #90 xanax prescribed 12/18.

## 2018-02-14 NOTE — Telephone Encounter (Signed)
See msg

## 2018-02-14 NOTE — Telephone Encounter (Signed)
For ALPRAZolam (XANAX) 0.5 MG tablet. Pt has requested an refill

## 2018-02-15 ENCOUNTER — Telehealth: Payer: Self-pay | Admitting: Obstetrics and Gynecology

## 2018-02-15 DIAGNOSIS — F329 Major depressive disorder, single episode, unspecified: Secondary | ICD-10-CM

## 2018-02-15 DIAGNOSIS — F419 Anxiety disorder, unspecified: Principal | ICD-10-CM

## 2018-02-15 DIAGNOSIS — F32A Depression, unspecified: Secondary | ICD-10-CM

## 2018-02-15 MED ORDER — VENLAFAXINE HCL ER 150 MG PO CP24
150.0000 mg | ORAL_CAPSULE | Freq: Every day | ORAL | 0 refills | Status: DC
Start: 2018-02-15 — End: 2018-04-11

## 2018-02-15 MED ORDER — ALPRAZOLAM 0.5 MG PO TABS: 0.5000 mg | ORAL_TABLET | Freq: Three times a day (TID) | ORAL | 0 refills | Status: DC | PRN

## 2018-02-15 NOTE — Telephone Encounter (Signed)
See telephone note 02/15/18.

## 2018-02-15 NOTE — Telephone Encounter (Signed)
Pt having a stressful time recently and having to take xanax some days multiple times, other days not at all. On Effexor 75 mg daily. Will increase effexor to 150 mg dose daily--[facility] Pt to call in a month with sx f/u. Can then send RF to [facility].  Rx RF xanax to [facility]. Pt aware it is addictive and is to be used as rescue med not regular tx.

## 2018-03-18 ENCOUNTER — Other Ambulatory Visit: Payer: Self-pay | Admitting: Obstetrics and Gynecology

## 2018-03-18 DIAGNOSIS — F32A Depression, unspecified: Secondary | ICD-10-CM

## 2018-03-18 DIAGNOSIS — F329 Major depressive disorder, single episode, unspecified: Secondary | ICD-10-CM

## 2018-03-18 DIAGNOSIS — F419 Anxiety disorder, unspecified: Principal | ICD-10-CM

## 2018-03-20 NOTE — Telephone Encounter (Signed)
Please advise. Thank you

## 2018-04-05 ENCOUNTER — Other Ambulatory Visit: Payer: Self-pay | Admitting: Obstetrics and Gynecology

## 2018-04-05 DIAGNOSIS — F32A Depression, unspecified: Secondary | ICD-10-CM

## 2018-04-05 DIAGNOSIS — F329 Major depressive disorder, single episode, unspecified: Secondary | ICD-10-CM

## 2018-04-05 DIAGNOSIS — F419 Anxiety disorder, unspecified: Principal | ICD-10-CM

## 2018-04-05 NOTE — Telephone Encounter (Signed)
Please advise. Thank you

## 2018-04-11 ENCOUNTER — Other Ambulatory Visit: Payer: Self-pay | Admitting: Obstetrics and Gynecology

## 2018-04-11 DIAGNOSIS — F32A Depression, unspecified: Secondary | ICD-10-CM

## 2018-04-11 DIAGNOSIS — F419 Anxiety disorder, unspecified: Principal | ICD-10-CM

## 2018-04-11 DIAGNOSIS — F329 Major depressive disorder, single episode, unspecified: Secondary | ICD-10-CM

## 2018-04-11 MED ORDER — VENLAFAXINE HCL ER 150 MG PO CP24: 150.0000 mg | ORAL_CAPSULE | Freq: Every day | ORAL | 0 refills | Status: DC

## 2018-04-11 NOTE — Progress Notes (Signed)
[facility] for 1 [facility] and [facility] for mail order. Doing well on effexor 150 mg dose.

## 2018-06-26 ENCOUNTER — Ambulatory Visit (INDEPENDENT_AMBULATORY_CARE_PROVIDER_SITE_OTHER): Payer: BLUE CROSS/BLUE SHIELD | Admitting: Obstetrics and Gynecology

## 2018-06-26 ENCOUNTER — Encounter: Payer: Self-pay | Admitting: Obstetrics and Gynecology

## 2018-06-26 ENCOUNTER — Other Ambulatory Visit (HOSPITAL_COMMUNITY)
Admission: RE | Admit: 2018-06-26 | Discharge: 2018-06-26 | Disposition: A | Discharge status: Home / Self Care | Payer: BLUE CROSS/BLUE SHIELD | Source: Ambulatory Visit | Attending: Obstetrics and Gynecology | Admitting: Obstetrics and Gynecology

## 2018-06-26 VITALS — BP 118/72 | HR 96 | Ht 68.0 in | Wt 167.0 lb

## 2018-06-26 DIAGNOSIS — Z1239 Encounter for other screening for malignant neoplasm of breast: Secondary | ICD-10-CM

## 2018-06-26 DIAGNOSIS — Z1231 Encounter for screening mammogram for malignant neoplasm of breast: Secondary | ICD-10-CM

## 2018-06-26 DIAGNOSIS — Z124 Encounter for screening for malignant neoplasm of cervix: Secondary | ICD-10-CM | POA: Diagnosis not present

## 2018-06-26 NOTE — Patient Instructions (Signed)
[facility] [place] [address]  [facility]  [address] [place] [address]  Phone: [phone]

## 2018-06-26 NOTE — Progress Notes (Signed)
Obstetrics & Gynecology Office Visit   Chief Complaint:  Chief Complaint  Patient presents with  . Follow-up    repeat pap smear    History of Present Illness:Rachel Patrick is a 42 y.o. woman who presents today for continued surveillance for history of dysplasia. 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    Pap/Treatment History:  LEEP 11/14/2017  Review of Systems: Review of Systems  Constitutional: Negative for chills and fever.  HENT: Negative for congestion.   Respiratory: Negative for cough and shortness of breath.   Cardiovascular: Negative for chest pain and palpitations.  Gastrointestinal: Negative for abdominal pain, constipation, diarrhea, heartburn, nausea and vomiting.  Genitourinary: Negative for dysuria, frequency and urgency.  Skin: Negative for itching and rash.  Neurological: Negative for dizziness and headaches.  Endo/Heme/Allergies: Negative for polydipsia.  Psychiatric/Behavioral: Negative for depression.   Past Medical History:  Past Medical History:  Diagnosis Date  . Depression   . History of mammogram 08/12/2013; 11 08-17   BIRAD I; NEG  . History of Papanicolaou smear of cervix 08/26/2014   -/-  . Insomnia   . Migraine     Past Surgical History:  Past Surgical History:  Procedure Laterality Date  . INTRAUTERINE DEVICE (IUD) INSERTION  05/10/2012    Gynecologic History: No LMP recorded. (Menstrual status: IUD).  Obstetric History: [id number]  Family History:  Family History  Problem Relation Age of Onset  . Lymphoma Father 19       LOW GRADE  . Parkinson's disease Father   . Cancer Father 81       PROSTATE  . Heart attack Brother 57  . Breast cancer Neg Hx     Social History:  Social History   Socioeconomic History  . Marital status: Married    Spouse name: Not on file  . Number of children: 2  . Years of  education: 53  . Highest education level: Not on file  Occupational History  . Occupation: ACTIVITIES COORDINATOR    Comment: [employer]  Social Needs  . Financial resource strain: Not on file  . Food insecurity:    Worry: Not on file    Inability: Not on file  . Transportation needs:    Medical: Not on file    Non-medical: Not on file  Tobacco Use  . Smoking status: Former Research scientist (life sciences)  . Smokeless tobacco: Never Used  . Tobacco comment: QUIT 2008  Substance and Sexual Activity  . Alcohol use: Yes    Comment: RARELY  . Drug use: No  . Sexual activity: Yes    Birth control/protection: IUD  Lifestyle  . Physical activity:    Days per week: Not on file    Minutes per session: Not on file  . Stress: Not on file  Relationships  . Social connections:    Talks on phone: Not on file    Gets together: Not on file    Attends religious service: Not on file    Active member of club or organization: Not on file    Attends meetings of clubs or organizations: Not on file    Relationship status: Not on file  . Intimate partner violence:    Fear of current or ex partner: Not on file    Emotionally abused: Not on file    Physically abused: Not on file  Forced sexual activity: Not on file  Other Topics Concern  . Not on file  Social History Narrative  . Not on file    Allergies:  No Known Allergies  Medications: Prior to Admission medications   Medication Sig Start Date End Date Taking? Authorizing Provider  ALPRAZolam Duanne Moron) 0.5 MG tablet Take 1 tablet (0.5 mg total) by mouth 3 (three) times daily as needed for anxiety. 08/18/09  Yes Copland, Deirdre Evener, PA-C  venlafaxine XR (EFFEXOR-XR) 150 MG 24 hr capsule Take 1 capsule (150 mg total) by mouth daily. 0/71/21  Yes Copland, Elmo Putt B, PA-C  zolpidem (AMBIEN) 10 MG tablet Take 1 tablet (10 mg total) by mouth at bedtime as needed for sleep. 9/75/88  Yes Copland, Deirdre Evener, PA-C  levonorgestrel (MIRENA) 20 MCG/24HR IUD 1 Intra  Uterine Device (1 each total) by Intrauterine route once. 05/05/17 01/28/53  Copland, Deirdre Evener, PA-C    Physical Exam Vitals:  Vitals:   06/26/18 0812  BP: 118/72  Pulse: 96   No LMP recorded. (Menstrual status: IUD).  General: NAD HEENT: normocephalic, anicteric Pulmonary: No increased work of breathing Genitourinary:  External: Normal external female genitalia.  Normal urethral meatus, normal  Bartholin's and Skene's glands.    Vagina: Normal vaginal mucosa, no evidence of prolapse.    Cervix: Grossly normal in appearance, no bleeding   lymphadenopathy Extremities: no edema, erythema Neurologic: Grossly intact Psychiatric: mood appropriate, affect full  Female chaperone present for pelvic and breast  portions of the physical exam  Assessment: 42 y.o. 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   - I had a lengthly discussion with Rachel Patrick  regarding the cause of dysplasia of the lower genital tract (including immunosuppression in the setting of HPV exposure and tobacco exposure). I explained the potential for progression to invasive malignancy, the recurrent nature of these lesions (and the need for close continued followup). Results of today's pap will dictate need for further evaluation and follow up per ASCCP guidelines..  - She is comfortable with the plan and had her questions answered.  - Return in about 6 months (around 12/27/2018) for annual.   Malachy Mood, MD, [place], [facility] 06/26/2018, 8:27 AM

## 2018-06-28 LAB — CYTOLOGY - PAP
Adequacy: ABSENT
Diagnosis: NEGATIVE
HPV: NOT DETECTED

## 2018-06-29 ENCOUNTER — Encounter (INDEPENDENT_AMBULATORY_CARE_PROVIDER_SITE_OTHER): Payer: Self-pay

## 2018-07-25 ENCOUNTER — Telehealth: Payer: Self-pay | Admitting: Obstetrics and Gynecology

## 2018-07-25 NOTE — Telephone Encounter (Signed)
Patient is calling to speak with [place] to missed call Please advise

## 2018-08-04 ENCOUNTER — Other Ambulatory Visit: Payer: Self-pay | Admitting: Obstetrics and Gynecology

## 2018-08-04 DIAGNOSIS — F32A Depression, unspecified: Secondary | ICD-10-CM

## 2018-08-04 DIAGNOSIS — F329 Major depressive disorder, single episode, unspecified: Secondary | ICD-10-CM

## 2018-08-04 DIAGNOSIS — F419 Anxiety disorder, unspecified: Principal | ICD-10-CM

## 2018-08-04 NOTE — Telephone Encounter (Signed)
Please advise 

## 2018-08-07 NOTE — Telephone Encounter (Signed)
Please advise 

## 2018-08-08 MED ORDER — VENLAFAXINE HCL ER 150 MG PO CP24: 150.0000 mg | ORAL_CAPSULE | Freq: Every day | ORAL | 1 refills | Status: DC

## 2018-08-08 MED ORDER — ALPRAZOLAM 0.5 MG PO TABS: 0.5000 mg | ORAL_TABLET | Freq: Three times a day (TID) | ORAL | 0 refills | Status: DC | PRN

## 2018-08-08 NOTE — Telephone Encounter (Signed)
Cont.Marland Kitchen

## 2018-11-01 ENCOUNTER — Other Ambulatory Visit: Payer: Self-pay | Admitting: Obstetrics and Gynecology

## 2018-11-01 DIAGNOSIS — F32A Depression, unspecified: Secondary | ICD-10-CM

## 2018-11-01 DIAGNOSIS — F329 Major depressive disorder, single episode, unspecified: Secondary | ICD-10-CM

## 2018-11-01 DIAGNOSIS — F419 Anxiety disorder, unspecified: Principal | ICD-10-CM

## 2018-11-01 MED ORDER — ALPRAZOLAM 0.5 MG PO TABS: 0.5000 mg | ORAL_TABLET | Freq: Three times a day (TID) | ORAL | 0 refills | Status: DC | PRN

## 2018-11-01 NOTE — Telephone Encounter (Signed)
Cont.Marland Kitchen

## 2018-11-01 NOTE — Telephone Encounter (Signed)
Please advise 

## 2018-11-07 ENCOUNTER — Ambulatory Visit
Admission: RE | Admit: 2018-11-07 | Discharge: 2018-11-07 | Disposition: A | Discharge status: Home / Self Care | Payer: BLUE CROSS/BLUE SHIELD | Source: Ambulatory Visit | Attending: Obstetrics and Gynecology | Admitting: Obstetrics and Gynecology

## 2018-11-07 DIAGNOSIS — Z1231 Encounter for screening mammogram for malignant neoplasm of breast: Secondary | ICD-10-CM | POA: Diagnosis not present

## 2018-11-07 DIAGNOSIS — Z1239 Encounter for other screening for malignant neoplasm of breast: Secondary | ICD-10-CM | POA: Insufficient documentation

## 2018-12-28 ENCOUNTER — Ambulatory Visit (INDEPENDENT_AMBULATORY_CARE_PROVIDER_SITE_OTHER): Payer: BLUE CROSS/BLUE SHIELD | Admitting: Obstetrics and Gynecology

## 2018-12-28 ENCOUNTER — Encounter: Payer: Self-pay | Admitting: Obstetrics and Gynecology

## 2018-12-28 ENCOUNTER — Other Ambulatory Visit (HOSPITAL_COMMUNITY)
Admission: RE | Admit: 2018-12-28 | Discharge: 2018-12-28 | Disposition: A | Discharge status: Home / Self Care | Payer: BLUE CROSS/BLUE SHIELD | Source: Ambulatory Visit | Attending: Obstetrics and Gynecology | Admitting: Obstetrics and Gynecology

## 2018-12-28 VITALS — BP 112/80 | HR 67 | Ht 68.0 in | Wt 186.0 lb

## 2018-12-28 DIAGNOSIS — F32A Depression, unspecified: Secondary | ICD-10-CM

## 2018-12-28 DIAGNOSIS — F329 Major depressive disorder, single episode, unspecified: Secondary | ICD-10-CM | POA: Diagnosis not present

## 2018-12-28 DIAGNOSIS — Z124 Encounter for screening for malignant neoplasm of cervix: Secondary | ICD-10-CM

## 2018-12-28 DIAGNOSIS — Z131 Encounter for screening for diabetes mellitus: Secondary | ICD-10-CM | POA: Diagnosis not present

## 2018-12-28 DIAGNOSIS — Z13220 Encounter for screening for lipoid disorders: Secondary | ICD-10-CM

## 2018-12-28 DIAGNOSIS — Z1321 Encounter for screening for nutritional disorder: Secondary | ICD-10-CM

## 2018-12-28 DIAGNOSIS — Z01419 Encounter for gynecological examination (general) (routine) without abnormal findings: Secondary | ICD-10-CM | POA: Diagnosis not present

## 2018-12-28 DIAGNOSIS — Z1239 Encounter for other screening for malignant neoplasm of breast: Secondary | ICD-10-CM

## 2018-12-28 DIAGNOSIS — F419 Anxiety disorder, unspecified: Secondary | ICD-10-CM | POA: Diagnosis not present

## 2018-12-28 DIAGNOSIS — Z1329 Encounter for screening for other suspected endocrine disorder: Secondary | ICD-10-CM

## 2018-12-28 MED ORDER — DULOXETINE HCL 30 MG PO CPEP: 30.0000 mg | ORAL_CAPSULE | Freq: Every day | ORAL | 3 refills | Status: DC

## 2018-12-28 MED ORDER — ALPRAZOLAM 0.5 MG PO TABS: 0.5000 mg | ORAL_TABLET | Freq: Three times a day (TID) | ORAL | 0 refills | Status: DC | PRN

## 2018-12-28 NOTE — Progress Notes (Signed)
Gynecology Annual Exam  PCP: Chad Cordial, PA-C  Chief Complaint:  Chief Complaint  Patient presents with  . Gynecologic Exam    Discuss Anxiety/Depression     History of Present Illness: Patient is a 43 y.o. [id number] presents for annual exam. The patient has no complaints today.   LMP: No LMP recorded. (Menstrual status: IUD). Absent secondary to IUD   The patient is sexually active. She currently uses IUD for contraception. She denies dyspareunia.  There is no notable family history of breast or ovarian cancer in her family.  The patient wears seatbelts: yes.   The patient has regular exercise: not asked.    The patient reports current symptoms of depression.    The patient is a 43 y.o. female presenting follow up for symptoms of anxiety and depression.  The patient is currently taking Effexor XR 12m for the management of her symptoms.  She has had any recent situational stressors, dad struggeling with Parkinson's and recent hip fracture, holidays, and work.  She reports symptoms of anhedonia, insomnia, irritability, social anxiety, feelings of guilt and visual hallucinations.  She denies risk taking behavior, agorophobia, suicidal ideation, homicidal ideation, auditory hallucinations and visual hallucinations. Symptoms have worsened since last visit.     The patient does have a pre-existing history of depression and anxiety.  She  does not a prior history of suicide attempts.   Review of Systems: Review of Systems  Constitutional: Negative for chills and fever.  HENT: Negative for congestion.   Respiratory: Negative for cough and shortness of breath.   Cardiovascular: Negative for chest pain and palpitations.  Gastrointestinal: Negative for abdominal pain, constipation, diarrhea, heartburn, nausea and vomiting.  Genitourinary: Negative for dysuria, frequency and urgency.  Skin: Negative for itching and rash.  Neurological: Negative for dizziness and headaches.    Endo/Heme/Allergies: Negative for polydipsia.  Psychiatric/Behavioral: Positive for depression. Negative for hallucinations, memory loss, substance abuse and suicidal ideas. The patient is nervous/anxious and has insomnia.     Past Medical History:  Past Medical History:  Diagnosis Date  . Depression   . History of mammogram 08/12/2013; 11 08-17   BIRAD I; NEG  . History of Papanicolaou smear of cervix 08/26/2014   -/-  . Insomnia   . Migraine     Past Surgical History:  Past Surgical History:  Procedure Laterality Date  . INTRAUTERINE DEVICE (IUD) INSERTION  05/10/2012    Gynecologic History:  No LMP recorded. (Menstrual status: IUD). Contraception:05/05/2017 Mirena IUD Last Pap: Results were:  06/26/2018 NIL and HR HPV negative  11/14/2017 LEEP ectocervix CIN I clear margins, endocervix and ECC negative 10/14/2017 Colposcopy CIN 2 at 5 and 12 O'Clock negative ECC 09/15/2017 HSIL HPV positive  Last mammogram: 11/07/2018 Results were: BI-RAD I  Obstetric History:: [id number] Family History:  Family History  Problem Relation Age of Onset  . Parkinson's disease Father   . Cancer Father 569      PROSTATE  . Non-Hodgkin's lymphoma Father        Low grade  . Heart attack Brother 567 . Breast cancer Neg Hx     Social History:  Social History   Socioeconomic History  . Marital status: Married    Spouse name: Not on file  . Number of children: 2  . Years of education: 136 . Highest education level: Not on file  Occupational History  . Occupation: ACTIVITIES COORDINATOR    Comment: [employer]  Social Needs  .  Financial resource strain: Not on file  . Food insecurity:    Worry: Not on file    Inability: Not on file  . Transportation needs:    Medical: Not on file    Non-medical: Not on file  Tobacco Use  . Smoking status: Former Research scientist (life sciences)  . Smokeless tobacco: Never Used  . Tobacco comment: QUIT 2008  Substance and Sexual Activity  . Alcohol use: Yes     Comment: RARELY  . Drug use: No  . Sexual activity: Yes    Birth control/protection: I.U.D.  Lifestyle  . Physical activity:    Days per week: Not on file    Minutes per session: Not on file  . Stress: Not on file  Relationships  . Social connections:    Talks on phone: Not on file    Gets together: Not on file    Attends religious service: Not on file    Active member of club or organization: Not on file    Attends meetings of clubs or organizations: Not on file    Relationship status: Not on file  . Intimate partner violence:    Fear of current or ex partner: Not on file    Emotionally abused: Not on file    Physically abused: Not on file    Forced sexual activity: Not on file  Other Topics Concern  . Not on file  Social History Narrative  . Not on file    Allergies:  No Known Allergies  Medications: Prior to Admission medications   Medication Sig Start Date End Date Taking? Authorizing Provider  ALPRAZolam Duanne Moron) 0.5 MG tablet Take 1 tablet (0.5 mg total) by mouth 3 (three) times daily as needed for anxiety. 78/9/38   Copland, Deirdre Evener, PA-C  levonorgestrel (MIRENA) 20 MCG/24HR IUD 1 Intra Uterine Device (1 each total) by Intrauterine route once. 05/05/17 1/0/17  Copland, Deirdre Evener, PA-C  venlafaxine XR (EFFEXOR-XR) 150 MG 24 hr capsule Take 1 capsule (150 mg total) by mouth daily. 04/07/24   Copland, Deirdre Evener, PA-C  zolpidem (AMBIEN) 10 MG tablet Take 1 tablet (10 mg total) by mouth at bedtime as needed for sleep. 8/52/77   Copland, Deirdre Evener, PA-C    Physical Exam Vitals: Blood pressure 112/80, pulse 67, height 5' 8"  (1.727 m), weight 186 lb (84.4 kg).  General: NAD HEENT: normocephalic, anicteric Thyroid: no enlargement, no palpable nodules Pulmonary: No increased work of breathing, CTAB Cardiovascular: RRR, distal pulses 2+ Breast: Breast symmetrical, no tenderness, no palpable nodules or masses, no skin or nipple retraction present, no nipple discharge.  No  axillary or supraclavicular lymphadenopathy. Abdomen: NABS, soft, non-tender, non-distended.  Umbilicus without lesions.  No hepatomegaly, splenomegaly or masses palpable. No evidence of hernia  Genitourinary:  External: Normal external female genitalia.  Normal urethral meatus, normal Bartholin's and Skene's glands.    Vagina: Normal vaginal mucosa, no evidence of prolapse.    Cervix: Grossly normal in appearance, no bleeding  Uterus: Non-enlarged, mobile, normal contour.  No CMT  Adnexa: ovaries non-enlarged, no adnexal masses  Rectal: deferred  Lymphatic: no evidence of inguinal lymphadenopathy Extremities: no edema, erythema, or tenderness Neurologic: Grossly intact Psychiatric: mood appropriate, affect full  Female chaperone present for pelvic and breast  portions of the physical exam     Assessment: 43 y.o. [id number] routine annual exam  Plan: Problem List Items Addressed This Visit      Other   Anxiety and depression   Relevant Medications   ALPRAZolam Duanne Moron)  0.5 MG tablet   DULoxetine (CYMBALTA) 30 MG capsule   Other Relevant Orders   CMP14+LP+TP+TSH+CBC/Plt   B12   Vitamin D (25 hydroxy)    Other Visit Diagnoses    Encounter for gynecological examination without abnormal finding    -  Primary   Relevant Orders   CMP14+LP+TP+TSH+CBC/Plt   B12   Vitamin D (25 hydroxy)   Screening for malignant neoplasm of cervix       Relevant Orders   Cytology - PAP   Breast screening       Diabetes mellitus screening       Relevant Orders   CMP14+LP+TP+TSH+CBC/Plt   Thyroid disorder screening       Relevant Orders   CMP14+LP+TP+TSH+CBC/Plt   Lipid screening       Relevant Orders   CMP14+LP+TP+TSH+CBC/Plt   Encounter for vitamin deficiency screening       Relevant Orders   B12   Vitamin D (25 hydroxy)      1) Mammogram - recommend yearly screening mammogram.  Mammogram Is up to date   2) STI screening  was notoffered and therefore not obtained  3) ASCCP  guidelines and rational discussed.  Patient opts for yearly screening interval (for 1 more year following CIN II diagnosis) may then return to routine screening  4) Contraception - the patient is currently using  IUD.  She is happy with her current form of contraception and plans to continue  5) Colonoscopy -- at age 11  6) Routine healthcare maintenance including cholesterol, diabetes screening discussed Ordered today   7) Anxiety and depression - GAD-7 is 11 PHQ-9 is 9.   - switch effexor to cymbalta 7m - refill Xanax - B12, TSH, CBC, vitamin D level today   7) Return in about 1 week (around 01/04/2019) for Medication follow .   AMalachy Mood MD, [facility], [place]1/30/2020, 9:08 AM

## 2018-12-29 LAB — CMP14+LP+TP+TSH+CBC/PLT
A/G RATIO: 1.8 (ref 1.2–2.2)
ALK PHOS: 62 IU/L (ref 39–117)
ALT: 9 IU/L (ref 0–32)
AST: 16 IU/L (ref 0–40)
Albumin: 4.8 g/dL (ref 3.8–4.8)
BUN/Creatinine Ratio: 11 (ref 9–23)
BUN: 10 mg/dL (ref 6–24)
Bilirubin Total: 0.5 mg/dL (ref 0.0–1.2)
CHOLESTEROL TOTAL: 192 mg/dL (ref 100–199)
CO2: 21 mmol/L (ref 20–29)
Calcium: 9.6 mg/dL (ref 8.7–10.2)
Chloride: 101 mmol/L (ref 96–106)
Creatinine, Ser: 0.89 mg/dL (ref 0.57–1.00)
FREE THYROXINE INDEX: 1.5 (ref 1.2–4.9)
GFR calc Af Amer: 92 mL/min/{1.73_m2} (ref >59)
GFR calc non Af Amer: 80 mL/min/{1.73_m2} (ref >59)
GLOBULIN, TOTAL: 2.6 g/dL (ref 1.5–4.5)
Glucose: 96 mg/dL (ref 65–99)
HDL: 65 mg/dL (ref >39)
Hematocrit: 47.5 % — ABNORMAL HIGH (ref 34.0–46.6)
Hemoglobin: 15.8 g/dL (ref 11.1–15.9)
LDL CALC: 105 mg/dL — AB (ref 0–99)
LDL/HDL RATIO: 1.6 ratio (ref 0.0–3.2)
MCH: 32.8 pg (ref 26.6–33.0)
MCHC: 33.3 g/dL (ref 31.5–35.7)
MCV: 99 fL — AB (ref 79–97)
PLATELETS: 285 10*3/uL (ref 150–450)
Potassium: 4.8 mmol/L (ref 3.5–5.2)
RBC: 4.82 x10E6/uL (ref 3.77–5.28)
RDW: 12.1 % (ref 11.7–15.4)
Sodium: 141 mmol/L (ref 134–144)
T3 UPTAKE RATIO: 25 % (ref 24–39)
T4 TOTAL: 6 ug/dL (ref 4.5–12.0)
TSH: 1.03 u[IU]/mL (ref 0.450–4.500)
Total Protein: 7.4 g/dL (ref 6.0–8.5)
Triglycerides: 108 mg/dL (ref 0–149)
VLDL Cholesterol Cal: 22 mg/dL (ref 5–40)
WBC: 4.5 10*3/uL (ref 3.4–10.8)

## 2018-12-29 LAB — VITAMIN D 25 HYDROXY (VIT D DEFICIENCY, FRACTURES): VIT D 25 HYDROXY: 30.9 ng/mL (ref 30.0–100.0)

## 2018-12-29 LAB — VITAMIN B12: Vitamin B-12: 241 pg/mL (ref 232–1245)

## 2019-01-01 LAB — CYTOLOGY - PAP
DIAGNOSIS: NEGATIVE
HPV (WINDOPATH): NOT DETECTED

## 2019-01-10 ENCOUNTER — Ambulatory Visit: Payer: BLUE CROSS/BLUE SHIELD | Admitting: Obstetrics and Gynecology

## 2019-01-17 ENCOUNTER — Ambulatory Visit (INDEPENDENT_AMBULATORY_CARE_PROVIDER_SITE_OTHER): Payer: BLUE CROSS/BLUE SHIELD | Admitting: Obstetrics and Gynecology

## 2019-01-17 ENCOUNTER — Encounter: Payer: Self-pay | Admitting: Obstetrics and Gynecology

## 2019-01-17 VITALS — BP 110/88 | HR 91 | Wt 189.0 lb

## 2019-01-17 DIAGNOSIS — F419 Anxiety disorder, unspecified: Secondary | ICD-10-CM

## 2019-01-17 DIAGNOSIS — F32A Depression, unspecified: Secondary | ICD-10-CM

## 2019-01-17 DIAGNOSIS — F329 Major depressive disorder, single episode, unspecified: Secondary | ICD-10-CM

## 2019-01-17 MED ORDER — DULOXETINE HCL 30 MG PO CPEP: 30.0000 mg | ORAL_CAPSULE | Freq: Every day | ORAL | 3 refills | Status: DC

## 2019-01-17 NOTE — Progress Notes (Signed)
Obstetrics & Gynecology Office Visit   Chief Complaint:  Chief Complaint  Patient presents with  . Follow-up    Medication     History of Present Illness: The patient is a 43 y.o. female presenting follow up for symptoms of anxiety and depression.  The patient is currently taking citalopram 20m for the management of her symptoms.  She has not had any recent situational stressors.  She reports good improvement in symptoms.  She dnies anhedonia, day time somnolence, insomnia, risk taking behavior, irritability, social anxiety, agorophobia, feelings of guilt, feelings of worthlessness, suicidal ideation, homicidal ideation, auditory hallucinations and visual hallucinations. Symptoms have improved since last visit.     The patient does have a pre-existing history of depression and anxiety.  She  does not a prior history of suicide attempts.    Review of Systems: Review of Systems  Constitutional: Negative.   Gastrointestinal: Negative for nausea.  Skin: Negative.   Neurological: Negative for headaches.  Psychiatric/Behavioral: Negative.      Past Medical History:  Past Medical History:  Diagnosis Date  . Depression   . History of mammogram 08/12/2013; 11 08-17   BIRAD I; NEG  . History of Papanicolaou smear of cervix 08/26/2014   -/-  . Insomnia   . Migraine     Past Surgical History:  Past Surgical History:  Procedure Laterality Date  . INTRAUTERINE DEVICE (IUD) INSERTION  05/10/2012    Gynecologic History: No LMP recorded. (Menstrual status: IUD).  Obstetric History: [id number] Family History:  Family History  Problem Relation Age of Onset  . Parkinson's disease Father   . Cancer Father 553      PROSTATE  . Non-Hodgkin's lymphoma Father        Low grade  . Heart attack Brother 510 . Breast cancer Neg Hx     Social History:  Social History   Socioeconomic History  . Marital status: Married    Spouse name: Not on file  . Number of children: 2  . Years  of education: 133 . Highest education level: Not on file  Occupational History  . Occupation: ACTIVITIES COORDINATOR    Comment: [employer]  Social Needs  . Financial resource strain: Not on file  . Food insecurity:    Worry: Not on file    Inability: Not on file  . Transportation needs:    Medical: Not on file    Non-medical: Not on file  Tobacco Use  . Smoking status: Former SResearch scientist (life sciences) . Smokeless tobacco: Never Used  . Tobacco comment: QUIT 2008  Substance and Sexual Activity  . Alcohol use: Yes    Comment: RARELY  . Drug use: No  . Sexual activity: Yes    Birth control/protection: I.U.D.  Lifestyle  . Physical activity:    Days per week: Not on file    Minutes per session: Not on file  . Stress: Not on file  Relationships  . Social connections:    Talks on phone: Not on file    Gets together: Not on file    Attends religious service: Not on file    Active member of club or organization: Not on file    Attends meetings of clubs or organizations: Not on file    Relationship status: Not on file  . Intimate partner violence:    Fear of current or ex partner: Not on file    Emotionally abused: Not on file    Physically  abused: Not on file    Forced sexual activity: Not on file  Other Topics Concern  . Not on file  Social History Narrative  . Not on file    Allergies:  No Known Allergies  Medications: Prior to Admission medications   Medication Sig Start Date End Date Taking? Authorizing Provider  ALPRAZolam Duanne Moron) 0.5 MG tablet Take 1 tablet (0.5 mg total) by mouth 3 (three) times daily as needed for anxiety. 12/28/18  Yes Malachy Mood, MD  DULoxetine (CYMBALTA) 30 MG capsule Take 1 capsule (30 mg total) by mouth daily. 01/17/19  Yes Malachy Mood, MD  levonorgestrel (MIRENA) 20 MCG/24HR IUD 1 Intra Uterine Device (1 each total) by Intrauterine route once. 05/05/17 8/0/99  Copland, Deirdre Evener, PA-C    Physical Exam Vitals:  Vitals:   01/17/19 1617    BP: 110/88  Pulse: 91   No LMP recorded. (Menstrual status: IUD).  General: NAD HEENT: normocephalic, anicteric Pulmonary: No increased work of breathing Neurologic: Grossly intact Psychiatric: mood appropriate, affect full    GAD 7 : Generalized Anxiety Score 01/17/2019 12/28/2018  Nervous, Anxious, on Edge 1 3  Control/stop worrying 1 2  Worry too much - different things 1 3  Trouble relaxing 1 1  Restless 0 0  Easily annoyed or irritable 0 2  Afraid - awful might happen 0 0  Total GAD 7 Score 4 11  Anxiety Difficulty Somewhat difficult Somewhat difficult    Depression screen [facility] 2/9 01/17/2019 12/28/2018  Decreased Interest 0 1  Down, Depressed, Hopeless 1 1  PHQ - 2 Score 1 2  Altered sleeping 3 2  Tired, decreased energy 2 3  Change in appetite 2 0  Feeling bad or failure about yourself  0 1  Trouble concentrating 1 1  Moving slowly or fidgety/restless 0 0  Suicidal thoughts 0 0  PHQ-9 Score 9 9  Difficult doing work/chores Somewhat difficult Somewhat difficult    Depression screen [facility] 2/9 01/17/2019 12/28/2018  Decreased Interest 0 1  Down, Depressed, Hopeless 1 1  PHQ - 2 Score 1 2  Altered sleeping 3 2  Tired, decreased energy 2 3  Change in appetite 2 0  Feeling bad or failure about yourself  0 1  Trouble concentrating 1 1  Moving slowly or fidgety/restless 0 0  Suicidal thoughts 0 0  PHQ-9 Score 9 9  Difficult doing work/chores Somewhat difficult Somewhat difficult     Assessment: 43 y.o. G2P2002 follow up anxiety and depression  Plan: Problem List Items Addressed This Visit      Other   Anxiety and depression - Primary   Relevant Medications   DULoxetine (CYMBALTA) 30 MG capsule      1)  Anxiety depression - follow up after starting citalopram - PHQ-9 is 9 item 9 is 0 - GAD-7 is 4 - Continue citalopram 54m  2) Thyroid and B12 screen has been obtained previously  3) Return in about 11 months (around 12/18/2019) for annual.   AMalachy Mood MD, [facility] [place]

## 2019-02-20 ENCOUNTER — Other Ambulatory Visit: Payer: Self-pay

## 2019-02-20 DIAGNOSIS — F329 Major depressive disorder, single episode, unspecified: Secondary | ICD-10-CM

## 2019-02-20 DIAGNOSIS — F419 Anxiety disorder, unspecified: Principal | ICD-10-CM

## 2019-02-20 DIAGNOSIS — F32A Depression, unspecified: Secondary | ICD-10-CM

## 2019-02-20 MED ORDER — ALPRAZOLAM 0.5 MG PO TABS: 0.5000 mg | ORAL_TABLET | Freq: Three times a day (TID) | ORAL | 0 refills | Status: DC | PRN

## 2019-02-20 NOTE — Telephone Encounter (Signed)
Advise

## 2019-04-27 DIAGNOSIS — H524 Presbyopia: Secondary | ICD-10-CM | POA: Diagnosis not present

## 2019-06-12 DIAGNOSIS — Z30430 Encounter for insertion of intrauterine contraceptive device: Secondary | ICD-10-CM | POA: Diagnosis not present

## 2019-06-12 DIAGNOSIS — Z3202 Encounter for pregnancy test, result negative: Secondary | ICD-10-CM | POA: Diagnosis not present

## 2019-06-13 DIAGNOSIS — Z03818 Encounter for observation for suspected exposure to other biological agents ruled out: Secondary | ICD-10-CM | POA: Diagnosis not present

## 2019-06-19 DIAGNOSIS — B342 Coronavirus infection, unspecified: Secondary | ICD-10-CM | POA: Diagnosis not present

## 2019-07-18 DIAGNOSIS — Z03818 Encounter for observation for suspected exposure to other biological agents ruled out: Secondary | ICD-10-CM | POA: Diagnosis not present

## 2019-07-20 DIAGNOSIS — F411 Generalized anxiety disorder: Secondary | ICD-10-CM | POA: Diagnosis not present

## 2019-07-20 DIAGNOSIS — Z30431 Encounter for routine checking of intrauterine contraceptive device: Secondary | ICD-10-CM | POA: Diagnosis not present

## 2019-08-01 DIAGNOSIS — Z03818 Encounter for observation for suspected exposure to other biological agents ruled out: Secondary | ICD-10-CM | POA: Diagnosis not present

## 2019-09-26 DIAGNOSIS — F411 Generalized anxiety disorder: Secondary | ICD-10-CM | POA: Diagnosis not present

## 2019-11-29 ENCOUNTER — Other Ambulatory Visit: Payer: Self-pay | Admitting: Obstetrics and Gynecology

## 2019-11-29 DIAGNOSIS — Z1231 Encounter for screening mammogram for malignant neoplasm of breast: Secondary | ICD-10-CM

## 2019-12-10 ENCOUNTER — Ambulatory Visit
Admission: RE | Admit: 2019-12-10 | Discharge: 2019-12-10 | Disposition: A | Discharge status: Home / Self Care | Payer: BC Managed Care – PPO | Source: Ambulatory Visit | Attending: Obstetrics and Gynecology | Admitting: Obstetrics and Gynecology

## 2019-12-10 DIAGNOSIS — Z1231 Encounter for screening mammogram for malignant neoplasm of breast: Secondary | ICD-10-CM

## 2019-12-25 ENCOUNTER — Ambulatory Visit (INDEPENDENT_AMBULATORY_CARE_PROVIDER_SITE_OTHER): Payer: BC Managed Care – PPO | Admitting: Obstetrics and Gynecology

## 2019-12-25 ENCOUNTER — Other Ambulatory Visit: Payer: Self-pay

## 2019-12-25 ENCOUNTER — Other Ambulatory Visit (HOSPITAL_COMMUNITY)
Admission: RE | Admit: 2019-12-25 | Discharge: 2019-12-25 | Disposition: A | Discharge status: Home / Self Care | Payer: BC Managed Care – PPO | Source: Ambulatory Visit | Attending: Obstetrics and Gynecology | Admitting: Obstetrics and Gynecology

## 2019-12-25 ENCOUNTER — Encounter: Payer: Self-pay | Admitting: Obstetrics and Gynecology

## 2019-12-25 VITALS — BP 108/80 | Ht 68.0 in | Wt 188.0 lb

## 2019-12-25 DIAGNOSIS — Z124 Encounter for screening for malignant neoplasm of cervix: Secondary | ICD-10-CM

## 2019-12-25 DIAGNOSIS — Z01419 Encounter for gynecological examination (general) (routine) without abnormal findings: Secondary | ICD-10-CM

## 2019-12-25 DIAGNOSIS — Z1151 Encounter for screening for human papillomavirus (HPV): Secondary | ICD-10-CM

## 2019-12-25 DIAGNOSIS — F419 Anxiety disorder, unspecified: Secondary | ICD-10-CM

## 2019-12-25 DIAGNOSIS — Z1231 Encounter for screening mammogram for malignant neoplasm of breast: Secondary | ICD-10-CM

## 2019-12-25 DIAGNOSIS — N871 Moderate cervical dysplasia: Secondary | ICD-10-CM | POA: Insufficient documentation

## 2019-12-25 DIAGNOSIS — F329 Major depressive disorder, single episode, unspecified: Secondary | ICD-10-CM

## 2019-12-25 DIAGNOSIS — F32A Depression, unspecified: Secondary | ICD-10-CM

## 2019-12-25 MED ORDER — ALPRAZOLAM 0.5 MG PO TABS: 0.5000 mg | ORAL_TABLET | Freq: Three times a day (TID) | ORAL | 0 refills | Status: DC | PRN

## 2019-12-25 MED ORDER — DULOXETINE HCL 60 MG PO CPEP: 60.0000 mg | ORAL_CAPSULE | Freq: Every day | ORAL | 0 refills | Status: DC

## 2019-12-25 NOTE — Patient Instructions (Signed)
I value your feedback and entrusting [place] with your care. If you get a [facility] patient survey, I would appreciate you taking the time to let us know about your experience today. Thank you!  As of November 08, 2019, your lab results will be released to your MyChart immediately, before I even have a chance to see them. Please give me time to review them and contact you if there are any abnormalities. Thank you for your patience.

## 2019-12-25 NOTE — Progress Notes (Signed)
PCP:  Chad Cordial, PA-C   Chief Complaint  Patient presents with  . Gynecologic Exam     HPI:      Ms. STEFHANIE Patrick is a 44 y.o. [id number] who LMP was No LMP recorded. (Menstrual status: IUD)., presents today for her annual examination.  Her menses are absent due to IUD; has rare spotting with wiping. Dysmenorrhea none. She does not have intermenstrual bleeding.  Sex activity: single partner, contraception - IUD. Mirena placed 05/05/17 Last Pap: 12/28/18 and 7/19  Results were: no abnormalities /neg HPV DNA. 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 S/p LEEP 12/18 with CIN 1/2 with Dr. Georgianne Fick. 2 normal pap since. Due for repeat today. If normal, then Q3 yrs. Hx of STDs: HPV  Last mammogram: 12/10/19  Results were: normal--routine follow-up in 12 months There is no FH of breast cancer. There is no FH of ovarian cancer. The patient does do self-breast exams.  Tobacco use: The patient denies current or previous tobacco use. Alcohol use: social drinker No drug use.  Exercise: moderately active  She does get adequate calcium but not Vitamin D in her diet.  She has issues with anxiety/depression and was started on effexor 8/18. Changed to cymbalta 30 mg last yr. Doing well but feels like she needs a higher dose. Did zoloft in distant past. Takes xanax rarely but needs RF.  Labs last yr.  Past Medical History:  Diagnosis Date  . Depression   . Dysplasia of cervix, high grade CIN 2 2019  . History of mammogram 08/12/2013; 11 08-17   BIRAD I; NEG  . History of Papanicolaou smear of cervix 08/26/2014   -/-  . Insomnia   . Migraine     Past Surgical History:  Procedure Laterality Date  . INTRAUTERINE DEVICE (IUD) INSERTION  05/10/2012  . LEEP  10/2017   Dr. Georgianne Fick    Family History  Problem Relation Age of Onset  . Parkinson's disease Father   . Cancer Father 14       PROSTATE  . Non-Hodgkin's lymphoma Father        Low grade  . Heart attack Brother 10  . Breast cancer  Neg Hx     Social History   Socioeconomic History  . Marital status: Married    Spouse name: Not on file  . Number of children: 2  . Years of education: 53  . Highest education level: Not on file  Occupational History  . Occupation: ACTIVITIES COORDINATOR    Comment: [employer]  Tobacco Use  . Smoking status: Former Research scientist (life sciences)  . Smokeless tobacco: Never Used  . Tobacco comment: QUIT 2008  Substance and Sexual Activity  . Alcohol use: Yes    Comment: RARELY  . Drug use: No  . Sexual activity: Yes    Birth control/protection: I.U.D.    Comment: Mirena  Other Topics Concern  . Not on file  Social History Narrative  . Not on file   Social Determinants of Health   Financial Resource Strain:   . Difficulty of Paying Living Expenses: Not on file  Food Insecurity:   . Worried About Charity fundraiser in the Last Year: Not on file  . Ran Out of Food in the Last Year: Not on file  Transportation Needs:   . Lack of Transportation (Medical): Not on file  . Lack of Transportation (Non-Medical): Not on file  Physical Activity:   . Days of Exercise per Week: Not on file  .  Minutes of Exercise per Session: Not on file  Stress:   . Feeling of Stress : Not on file  Social Connections:   . Frequency of Communication with Friends and Family: Not on file  . Frequency of Social Gatherings with Friends and Family: Not on file  . Attends Religious Services: Not on file  . Active Member of Clubs or Organizations: Not on file  . Attends Archivist Meetings: Not on file  . Marital Status: Not on file  Intimate Partner Violence:   . Fear of Current or Ex-Partner: Not on file  . Emotionally Abused: Not on file  . Physically Abused: Not on file  . Sexually Abused: Not on file    Current Meds  Medication Sig  . DULoxetine (CYMBALTA) 60 MG capsule Take 1 capsule (60 mg total) by mouth daily.  . [DISCONTINUED] DULoxetine (CYMBALTA) 30 MG capsule Take 1 capsule (30 mg total)  by mouth daily.     ROS:  Review of Systems  Constitutional: Positive for fatigue. Negative for fever and unexpected weight change.  Respiratory: Negative for cough, shortness of breath and wheezing.   Cardiovascular: Negative for chest pain, palpitations and leg swelling.  Gastrointestinal: Negative for blood in stool, constipation, diarrhea, nausea and vomiting.  Endocrine: Negative for cold intolerance, heat intolerance and polyuria.  Genitourinary: Negative for dyspareunia, dysuria, flank pain, frequency, genital sores, hematuria, menstrual problem, pelvic pain, urgency, vaginal bleeding, vaginal discharge and vaginal pain.  Musculoskeletal: Negative for back pain, joint swelling and myalgias.  Skin: Negative for rash.  Neurological: Positive for headaches. Negative for dizziness, syncope, light-headedness and numbness.  Hematological: Negative for adenopathy.  Psychiatric/Behavioral: Negative for agitation, confusion, sleep disturbance and suicidal ideas. The patient is not nervous/anxious.      Objective: BP 108/80   Ht 5' 8"  (1.727 m)   Wt 188 lb (85.3 kg)   BMI 28.59 kg/m    Physical Exam Constitutional:      Appearance: She is well-developed.  Genitourinary:     Vulva, vagina, cervix, uterus, right adnexa and left adnexa normal.     No vulval lesion or tenderness noted.     No vaginal discharge, erythema or tenderness.     No cervical polyp.     IUD strings visualized.     Uterus is not enlarged or tender.     No right or left adnexal mass present.     Right adnexa not tender.     Left adnexa not tender.  Neck:     Thyroid: No thyromegaly.  Cardiovascular:     Rate and Rhythm: Normal rate and regular rhythm.     Heart sounds: Normal heart sounds. No murmur.  Pulmonary:     Effort: Pulmonary effort is normal.     Breath sounds: Normal breath sounds.  Chest:     Breasts:        Right: No mass, nipple discharge, skin change or tenderness.        Left: No  mass, nipple discharge, skin change or tenderness.  Abdominal:     Palpations: Abdomen is soft.     Tenderness: There is no abdominal tenderness. There is no guarding.  Musculoskeletal:        General: Normal range of motion.     Cervical back: Normal range of motion.  Neurological:     General: No focal deficit present.     Mental Status: She is alert and oriented to person, place, and time.  Cranial Nerves: No cranial nerve deficit.  Skin:    General: Skin is warm and dry.  Psychiatric:        Mood and Affect: Mood normal.        Behavior: Behavior normal.        Thought Content: Thought content normal.        Judgment: Judgment normal.  Vitals reviewed.   RESULTS:  GAD 7 : Generalized Anxiety Score 12/25/2019 01/17/2019 12/28/2018  Nervous, Anxious, on Edge 1 1 3   Control/stop worrying 0 1 2  Worry too much - different things 1 1 3   Trouble relaxing 1 1 1   Restless 0 0 0  Easily annoyed or irritable 2 0 2  Afraid - awful might happen 0 0 0  Total GAD 7 Score 5 4 11   Anxiety Difficulty Not difficult at all Somewhat difficult Somewhat difficult    Depression screen [facility] 2/9 12/25/2019  Decreased Interest 0  Down, Depressed, Hopeless 1  PHQ - 2 Score 1  Altered sleeping 2  Tired, decreased energy 3  Change in appetite 0  Feeling bad or failure about yourself  0  Trouble concentrating 1  Moving slowly or fidgety/restless 0  Suicidal thoughts 0  PHQ-9 Score 7  Difficult doing work/chores Somewhat difficult     Assessment/Plan: Encounter for annual routine gynecological examination  Cervical cancer screening - Plan: Cytology - PAP  Screening for HPV (human papillomavirus) - Plan: Cytology - PAP  Dysplasia of cervix, high grade CIN 2 - Plan: Cytology - PAP; Repeat pap today.  Encounter for screening mammogram for malignant neoplasm of breast; pt current on mammo  Anxiety and depression - Plan: DULoxetine (CYMBALTA) 60 MG capsule, ALPRAZolam (XANAX) 0.5 MG  tablet; Increase cymbalta to 60 mg daily. F/u in 6 wks via phone with sx control. Rx RF xanax prn to take sparingly.    Meds ordered this encounter  Medications  . DULoxetine (CYMBALTA) 60 MG capsule    Sig: Take 1 capsule (60 mg total) by mouth daily.    Dispense:  90 capsule    Refill:  0    Order Specific Question:   Supervising Provider    Answer:   Gae Dry [id number]  . ALPRAZolam (XANAX) 0.5 MG tablet    Sig: Take 1 tablet (0.5 mg total) by mouth 3 (three) times daily as needed for anxiety.    Dispense:  30 tablet    Refill:  0    Order Specific Question:   Supervising Provider    Answer:   Gae Dry [[id number]]  Sent to [place].            GYN counsel breast self exam, mammography screening, adequate intake of calcium and vitamin D, diet and exercise     F/U  Return in about 1 year (around 12/24/2020).  July Nickson B. Hanin [place], PA-C 12/25/2019 3:42 PM

## 2019-12-27 LAB — CYTOLOGY - PAP
Comment: NEGATIVE
Diagnosis: NEGATIVE
High risk HPV: NEGATIVE

## 2019-12-31 ENCOUNTER — Ambulatory Visit: Payer: BC Managed Care – PPO | Admitting: Obstetrics and Gynecology

## 2020-05-06 ENCOUNTER — Telehealth: Payer: Self-pay

## 2020-05-06 NOTE — Telephone Encounter (Signed)
Per [facility], called pt to follow up on Cymbalta 60 mg, is it controlling anxiety/depression sx? Pt should've followed up 6 weeks via phone after last visit. No answer, LVMTRC.

## 2020-05-26 ENCOUNTER — Other Ambulatory Visit: Payer: Self-pay | Admitting: Obstetrics and Gynecology

## 2020-05-26 DIAGNOSIS — F32A Depression, unspecified: Secondary | ICD-10-CM

## 2020-05-26 DIAGNOSIS — F419 Anxiety disorder, unspecified: Secondary | ICD-10-CM

## 2020-05-26 MED ORDER — DULOXETINE HCL 60 MG PO CPEP: 60.0000 mg | ORAL_CAPSULE | Freq: Every day | ORAL | 1 refills | Status: DC

## 2020-05-26 MED ORDER — ALPRAZOLAM 0.5 MG PO TABS: 0.5000 mg | ORAL_TABLET | Freq: Three times a day (TID) | ORAL | 0 refills | Status: DC | PRN

## 2020-05-26 NOTE — Telephone Encounter (Signed)
Pt aware.

## 2020-12-04 NOTE — Progress Notes (Signed)
PCP:  Chad Cordial, PA-C   Chief Complaint  Patient presents with  . Gynecologic Exam    Annual - no concerns. RM 13     HPI:      Ms. Rachel Patrick is a 45 y.o. [id number] who LMP was No LMP recorded. (Menstrual status: IUD)., presents today for her annual examination.  Her menses are absent due to IUD; no BTB. Dysmenorrhea none.   Sex activity: single partner, contraception - IUD. Mirena placed 05/05/17 Last Pap: 12/25/19, 12/28/18 and 7/19  Results were: no abnormalities /neg HPV DNA. 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 S/p LEEP 12/18 with CIN 1/2 with Dr. Georgianne Fick. 3 normal pap since. Due for repeat Q3 yrs now. Hx of STDs: HPV  Last mammogram: 12/10/19  Results were: normal--routine follow-up in 12 months There is no FH of breast cancer. There is no FH of ovarian cancer. The patient does do self-breast exams.  Tobacco use: The patient denies current or previous tobacco use. Alcohol use: social drinker No drug use.  Exercise: min active  She does get adequate calcium and Vitamin D in her diet.  She has issues with anxiety/depression and was started on effexor 8/18. Changed to cymbalta 30 mg last yr. Did well but needed to increase dose to 60 mg and this is working much better. Does have decreased libido as side effect but doesn't want to change med. Did zoloft in distant past without relief. Takes xanax rarely   Fasting labs due  Past Medical History:  Diagnosis Date  . Depression   . Dysplasia of cervix, high grade CIN 2 2019  . History of mammogram 08/12/2013; 11 08-17   BIRAD I; NEG  . History of Papanicolaou smear of cervix 08/26/2014   -/-  . Insomnia   . Migraine     Past Surgical History:  Procedure Laterality Date  . INTRAUTERINE DEVICE (IUD) INSERTION  05/10/2012  . LEEP  10/2017   Dr. Georgianne Fick    Family History  Problem Relation Age of Onset  . Parkinson's disease Father   . Cancer Father 13       PROSTATE  . Non-Hodgkin's lymphoma Father        Low  grade  . Heart attack Brother 29  . Breast cancer Neg Hx     Social History   Socioeconomic History  . Marital status: Married    Spouse name: Not on file  . Number of children: 2  . Years of education: 71  . Highest education level: Not on file  Occupational History  . Occupation: ACTIVITIES COORDINATOR    Comment: [employer]  Tobacco Use  . Smoking status: Former Research scientist (life sciences)  . Smokeless tobacco: Never Used  . Tobacco comment: QUIT 2008  Vaping Use  . Vaping Use: Never used  Substance and Sexual Activity  . Alcohol use: Yes    Comment: RARELY  . Drug use: No  . Sexual activity: Yes    Birth control/protection: I.U.D.    Comment: Mirena  Other Topics Concern  . Not on file  Social History Narrative  . Not on file   Social Determinants of Health   Financial Resource Strain: Not on file  Food Insecurity: Not on file  Transportation Needs: Not on file  Physical Activity: Not on file  Stress: Not on file  Social Connections: Not on file  Intimate Partner Violence: Not on file    Current Meds  Medication Sig  . ALPRAZolam (XANAX) 0.5 MG tablet  Take 1 tablet (0.5 mg total) by mouth 3 (three) times daily as needed for anxiety.  . [DISCONTINUED] DULoxetine (CYMBALTA) 60 MG capsule Take 1 capsule (60 mg total) by mouth daily.     ROS:  Review of Systems  Constitutional: Positive for fatigue. Negative for fever and unexpected weight change.  Respiratory: Negative for cough, shortness of breath and wheezing.   Cardiovascular: Negative for chest pain, palpitations and leg swelling.  Gastrointestinal: Negative for blood in stool, constipation, diarrhea, nausea and vomiting.  Endocrine: Negative for cold intolerance, heat intolerance and polyuria.  Genitourinary: Negative for dyspareunia, dysuria, flank pain, frequency, genital sores, hematuria, menstrual problem, pelvic pain, urgency, vaginal bleeding, vaginal discharge and vaginal pain.  Musculoskeletal: Negative for  back pain, joint swelling and myalgias.  Skin: Negative for rash.  Neurological: Positive for headaches. Negative for dizziness, syncope, light-headedness and numbness.  Hematological: Negative for adenopathy.  Psychiatric/Behavioral: Positive for agitation and dysphoric mood. Negative for confusion, sleep disturbance and suicidal ideas. The patient is not nervous/anxious.      Objective: BP 118/78   Ht 5' 8"  (1.727 m)   Wt 171 lb (77.6 kg)   BMI 26.00 kg/m    Physical Exam Constitutional:      Appearance: She is well-developed.  Genitourinary:     Vulva normal.     Right Labia: No rash, tenderness or lesions.    Left Labia: No tenderness, lesions or rash.    No vaginal discharge, erythema or tenderness.      Right Adnexa: not tender and no mass present.    Left Adnexa: not tender and no mass present.    No cervical friability or polyp.     IUD strings visualized.     Uterus is not enlarged or tender.  Breasts:     Right: No mass, nipple discharge, skin change or tenderness.     Left: No mass, nipple discharge, skin change or tenderness.    Neck:     Thyroid: No thyromegaly.  Cardiovascular:     Rate and Rhythm: Normal rate and regular rhythm.     Heart sounds: Normal heart sounds. No murmur heard.   Pulmonary:     Effort: Pulmonary effort is normal.     Breath sounds: Normal breath sounds.  Abdominal:     Palpations: Abdomen is soft.     Tenderness: There is no abdominal tenderness. There is no guarding or rebound.  Musculoskeletal:        General: Normal range of motion.     Cervical back: Normal range of motion.  Lymphadenopathy:     Cervical: No cervical adenopathy.  Neurological:     General: No focal deficit present.     Mental Status: She is alert and oriented to person, place, and time.     Cranial Nerves: No cranial nerve deficit.  Skin:    General: Skin is warm and dry.  Psychiatric:        Mood and Affect: Mood normal.        Behavior: Behavior  normal.        Thought Content: Thought content normal.        Judgment: Judgment normal.  Vitals reviewed.    Assessment/Plan: Encounter for annual routine gynecological examination  Encounter for routine checking of intrauterine contraceptive device (IUD)--IUD strings in cx os; has 7 yr indication now  Encounter for screening mammogram for malignant neoplasm of breast - Plan: MM 3D SCREEN BREAST BILATERAL; pt to sched mammo  Anxiety  and depression - Plan: DULoxetine (CYMBALTA) 60 MG capsule; doing well. Rx RF to mail order  Screening cholesterol level - Plan: Comprehensive metabolic panel, Lipid panel  Blood tests for routine general physical examination - Plan: Lipid panel    Meds ordered this encounter  Medications  . DULoxetine (CYMBALTA) 60 MG capsule    Sig: Take 1 capsule (60 mg total) by mouth daily.    Dispense:  90 capsule    Refill:  3    Order Specific Question:   Supervising Provider    Answer:   Gae Dry [[id number]]  Sent to [place].            GYN counsel breast self exam, mammography screening, adequate intake of calcium and vitamin D, diet and exercise     F/U  Return in about 1 year (around 12/05/2021).  Rachel Gorter B. Floetta Brickey, PA-C 12/05/2020 10:18 AM

## 2020-12-04 NOTE — Patient Instructions (Incomplete)
I value your feedback and you entrusting [place] with your care. If you get a [place] patient survey, I would appreciate you taking the time to let us know about your experience today. Thank you!  [place] at [facility]: [phone]

## 2020-12-05 ENCOUNTER — Other Ambulatory Visit: Payer: Self-pay

## 2020-12-05 ENCOUNTER — Encounter: Payer: Self-pay | Admitting: Obstetrics and Gynecology

## 2020-12-05 ENCOUNTER — Ambulatory Visit (INDEPENDENT_AMBULATORY_CARE_PROVIDER_SITE_OTHER): Payer: No Typology Code available for payment source | Admitting: Obstetrics and Gynecology

## 2020-12-05 VITALS — BP 118/78 | Ht 68.0 in | Wt 171.0 lb

## 2020-12-05 DIAGNOSIS — Z13220 Encounter for screening for lipoid disorders: Secondary | ICD-10-CM

## 2020-12-05 DIAGNOSIS — Z1231 Encounter for screening mammogram for malignant neoplasm of breast: Secondary | ICD-10-CM | POA: Diagnosis not present

## 2020-12-05 DIAGNOSIS — Z30431 Encounter for routine checking of intrauterine contraceptive device: Secondary | ICD-10-CM | POA: Diagnosis not present

## 2020-12-05 DIAGNOSIS — F419 Anxiety disorder, unspecified: Secondary | ICD-10-CM | POA: Diagnosis not present

## 2020-12-05 DIAGNOSIS — Z01419 Encounter for gynecological examination (general) (routine) without abnormal findings: Secondary | ICD-10-CM | POA: Diagnosis not present

## 2020-12-05 DIAGNOSIS — F32A Depression, unspecified: Secondary | ICD-10-CM

## 2020-12-05 DIAGNOSIS — Z0000 Encounter for general adult medical examination without abnormal findings: Secondary | ICD-10-CM

## 2020-12-05 MED ORDER — DULOXETINE HCL 60 MG PO CPEP: 60.0000 mg | ORAL_CAPSULE | Freq: Every day | ORAL | 3 refills | Status: DC

## 2020-12-18 ENCOUNTER — Other Ambulatory Visit: Payer: Self-pay | Admitting: Obstetrics and Gynecology

## 2020-12-18 ENCOUNTER — Encounter: Payer: Self-pay | Admitting: Obstetrics and Gynecology

## 2020-12-18 DIAGNOSIS — F419 Anxiety disorder, unspecified: Secondary | ICD-10-CM

## 2020-12-18 DIAGNOSIS — F32A Depression, unspecified: Secondary | ICD-10-CM

## 2020-12-18 NOTE — Telephone Encounter (Signed)
Please advise. Mychart message also sent to you.

## 2020-12-21 ENCOUNTER — Other Ambulatory Visit: Payer: Self-pay | Admitting: Obstetrics and Gynecology

## 2020-12-21 DIAGNOSIS — F419 Anxiety disorder, unspecified: Secondary | ICD-10-CM

## 2020-12-21 DIAGNOSIS — F32A Depression, unspecified: Secondary | ICD-10-CM

## 2020-12-21 MED ORDER — ALPRAZOLAM 0.5 MG PO TABS
0.5000 mg | ORAL_TABLET | Freq: Three times a day (TID) | ORAL | 0 refills | Status: DC | PRN
Start: 2020-12-21 — End: 2024-10-09

## 2020-12-21 NOTE — Progress Notes (Signed)
Rx RF xanax prn anxiety.

## 2020-12-31 ENCOUNTER — Other Ambulatory Visit: Payer: Self-pay | Admitting: Obstetrics and Gynecology

## 2020-12-31 DIAGNOSIS — F32A Depression, unspecified: Secondary | ICD-10-CM

## 2021-01-05 ENCOUNTER — Other Ambulatory Visit: Payer: Self-pay | Admitting: Obstetrics and Gynecology

## 2021-01-05 DIAGNOSIS — F419 Anxiety disorder, unspecified: Secondary | ICD-10-CM

## 2021-01-05 DIAGNOSIS — F32A Depression, unspecified: Secondary | ICD-10-CM

## 2022-02-03 ENCOUNTER — Telehealth: Payer: Self-pay

## 2022-02-03 ENCOUNTER — Encounter: Payer: Self-pay | Admitting: Obstetrics and Gynecology

## 2022-02-03 ENCOUNTER — Other Ambulatory Visit: Payer: Self-pay | Admitting: Obstetrics and Gynecology

## 2022-02-03 DIAGNOSIS — Z1231 Encounter for screening mammogram for malignant neoplasm of breast: Secondary | ICD-10-CM

## 2022-02-03 DIAGNOSIS — F32A Depression, unspecified: Secondary | ICD-10-CM

## 2022-02-03 DIAGNOSIS — Z13220 Encounter for screening for lipoid disorders: Secondary | ICD-10-CM

## 2022-02-03 DIAGNOSIS — Z0000 Encounter for general adult medical examination without abnormal findings: Secondary | ICD-10-CM

## 2022-02-03 MED ORDER — DULOXETINE HCL 60 MG PO CPEP: 60.0000 mg | ORAL_CAPSULE | Freq: Every day | ORAL | 0 refills | Status: DC

## 2022-02-03 MED ORDER — DULOXETINE HCL 60 MG PO CPEP
60.0000 mg | ORAL_CAPSULE | Freq: Every day | ORAL | 0 refills | Status: DC
Start: 2022-02-03 — End: 2022-03-09

## 2022-02-03 NOTE — Progress Notes (Signed)
Rx RF cymbalta till 4/23 annaul. Needs to [facility] locally, then Rx to mail order.  ?

## 2022-02-03 NOTE — Telephone Encounter (Signed)
Pt calling; has emailed [place] twice to schedule an appt and hasn't heard anything; pharm hasn't heard from [place] regarding cymbalta refill; pt needs to schedule annual exam and needs cymbalta refilled.  [phone] ?

## 2022-02-03 NOTE — Telephone Encounter (Signed)
Done

## 2022-02-03 NOTE — Telephone Encounter (Signed)
Pt calling; is now sched for 03/09/22; needs 30d supply of cymbalta sent to [facility] and the rest to her mail order pharm so she won't go more days than she has to without it.  [phone] ?

## 2022-02-04 NOTE — Telephone Encounter (Signed)
Pt aware.

## 2022-03-02 ENCOUNTER — Other Ambulatory Visit: Payer: Self-pay | Admitting: Obstetrics and Gynecology

## 2022-03-02 ENCOUNTER — Other Ambulatory Visit: Payer: BC Managed Care – PPO

## 2022-03-02 ENCOUNTER — Ambulatory Visit
Admission: RE | Admit: 2022-03-02 | Discharge: 2022-03-02 | Disposition: A | Discharge status: Home / Self Care | Payer: BC Managed Care – PPO | Source: Ambulatory Visit | Attending: Obstetrics and Gynecology | Admitting: Obstetrics and Gynecology

## 2022-03-02 DIAGNOSIS — Z13220 Encounter for screening for lipoid disorders: Secondary | ICD-10-CM

## 2022-03-02 DIAGNOSIS — Z1231 Encounter for screening mammogram for malignant neoplasm of breast: Secondary | ICD-10-CM | POA: Diagnosis not present

## 2022-03-02 DIAGNOSIS — Z0000 Encounter for general adult medical examination without abnormal findings: Secondary | ICD-10-CM | POA: Diagnosis not present

## 2022-03-02 DIAGNOSIS — F32A Depression, unspecified: Secondary | ICD-10-CM

## 2022-03-03 LAB — LIPID PANEL
Chol/HDL Ratio: 2.9 ratio (ref 0.0–4.4)
Cholesterol, Total: 174 mg/dL (ref 100–199)
HDL: 60 mg/dL (ref >39)
LDL Chol Calc (NIH): 96 mg/dL (ref 0–99)
Triglycerides: 99 mg/dL (ref 0–149)
VLDL Cholesterol Cal: 18 mg/dL (ref 5–40)

## 2022-03-03 LAB — COMPREHENSIVE METABOLIC PANEL
ALT: 11 IU/L (ref 0–32)
AST: 18 IU/L (ref 0–40)
Albumin/Globulin Ratio: 1.7 (ref 1.2–2.2)
Albumin: 4.6 g/dL (ref 3.8–4.8)
Alkaline Phosphatase: 59 IU/L (ref 44–121)
BUN/Creatinine Ratio: 9 (ref 9–23)
BUN: 8 mg/dL (ref 6–24)
Bilirubin Total: 0.5 mg/dL (ref 0.0–1.2)
CO2: 22 mmol/L (ref 20–29)
Calcium: 10 mg/dL (ref 8.7–10.2)
Chloride: 104 mmol/L (ref 96–106)
Creatinine, Ser: 0.89 mg/dL (ref 0.57–1.00)
Globulin, Total: 2.7 g/dL (ref 1.5–4.5)
Glucose: 79 mg/dL (ref 70–99)
Potassium: 4.8 mmol/L (ref 3.5–5.2)
Sodium: 141 mmol/L (ref 134–144)
Total Protein: 7.3 g/dL (ref 6.0–8.5)
eGFR: 81 mL/min/{1.73_m2} (ref >59)

## 2022-03-08 NOTE — Progress Notes (Signed)
? ?PCP:  Adasha Boehme, Deirdre Evener, PA-C ? ? ?Chief Complaint  ?Patient presents with  ? Gynecologic Exam  ?  No concerns  ? ? ? ?HPI: ?     Ms. Rachel Patrick is a 46 y.o. [id number] who LMP was No LMP recorded. (Menstrual status: IUD)., presents today for her annual examination.  Her menses are absent due to IUD; no BTB. Dysmenorrhea none. Occas vasomotor sx.  ? ?Sex activity: single partner, contraception - IUD. Mirena placed 05/05/17. No pain/bleeding.  ?Last Pap: 12/25/19, 12/28/18 and 7/19  Results were: no abnormalities /neg HPV DNA. 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 S/p LEEP 12/18 with CIN 1/2 with Dr. Georgianne Fick. 3 normal pap since. Due for repeat Q3 yrs now but would like this yr due to recent dx of uterine cancer in her mom. ?Hx of STDs: HPV ? ?Last mammogram: 03/02/22  Results were: normal--routine follow-up in 12 months ?There is no FH of breast cancer. There is no FH of ovarian cancer. The patient does self-breast exams. ? ?Tobacco use: The patient denies current or previous tobacco use. ?Alcohol use: few drinks wkly ?No drug use.  ?Exercise: min active ? ?Colonoscopy: never; no rectal bleeding ? ?She does get adequate calcium and Vitamin D in her diet. ? ?She has issues with anxiety/depression and doing well on cymbalta 60 mg daily. Does have decreased libido as side effect but doesn't want to change med. Did zoloft and effexor in distant past without relief. Takes xanax rarely  ? ?Normal fasting labs 4/23 ? ?Past Medical History:  ?Diagnosis Date  ? Depression   ? Dysplasia of cervix, high grade CIN 2 2019  ? History of mammogram 08/12/2013; 11 08-17  ? BIRAD I; NEG  ? History of Papanicolaou smear of cervix 08/26/2014  ? -/-  ? Insomnia   ? Migraine   ? ? ?Past Surgical History:  ?Procedure Laterality Date  ? INTRAUTERINE DEVICE (IUD) INSERTION  05/10/2012  ? LEEP  10/2017  ? Dr. Georgianne Fick  ? ? ?Family History  ?Problem Relation Age of Onset  ? Uterine cancer Mother 55  ? Parkinson's disease Father   ? Cancer Father 64   ?     PROSTATE  ? Non-Hodgkin's lymphoma Father   ?     Low grade  ? Heart attack Brother 3  ? Breast cancer Neg Hx   ? ? ?Social History  ? ?Socioeconomic History  ? Marital status: Married  ?  Spouse name: Not on file  ? Number of children: 2  ? Years of education: 57  ? Highest education level: Not on file  ?Occupational History  ? Occupation: ACTIVITIES COORDINATOR  ?  Comment: [employer]  ?Tobacco Use  ? Smoking status: Former  ? Smokeless tobacco: Never  ? Tobacco comments:  ?  QUIT 2008  ?Vaping Use  ? Vaping Use: Never used  ?Substance and Sexual Activity  ? Alcohol use: Yes  ?  Comment: RARELY  ? Drug use: No  ? Sexual activity: Yes  ?  Birth control/protection: I.U.D.  ?  Comment: Mirena  ?Other Topics Concern  ? Not on file  ?Social History Narrative  ? Not on file  ? ?Social Determinants of Health  ? ?Financial Resource Strain: Not on file  ?Food Insecurity: Not on file  ?Transportation Needs: Not on file  ?Physical Activity: Not on file  ?Stress: Not on file  ?Social Connections: Not on file  ?Intimate Partner Violence: Not on file  ? ? ?  Current Meds  ?Medication Sig  ? ALPRAZolam (XANAX) 0.5 MG tablet Take 1 tablet (0.5 mg total) by mouth 3 (three) times daily as needed for anxiety.  ? [DISCONTINUED] DULoxetine (CYMBALTA) 60 MG capsule Take 1 capsule (60 mg total) by mouth daily.  ? [DISCONTINUED] DULoxetine (CYMBALTA) 60 MG capsule Take 1 capsule (60 mg total) by mouth daily.  ? ? ? ?ROS: ? ?Review of Systems  ?Constitutional:  Negative for fatigue, fever and unexpected weight change.  ?Respiratory:  Negative for cough, shortness of breath and wheezing.   ?Cardiovascular:  Negative for chest pain, palpitations and leg swelling.  ?Gastrointestinal:  Positive for nausea. Negative for blood in stool, constipation, diarrhea and vomiting.  ?Endocrine: Negative for cold intolerance, heat intolerance and polyuria.  ?Genitourinary:  Negative for dyspareunia, dysuria, flank pain, frequency, genital  sores, hematuria, menstrual problem, pelvic pain, urgency, vaginal bleeding, vaginal discharge and vaginal pain.  ?Musculoskeletal:  Negative for back pain, joint swelling and myalgias.  ?Skin:  Negative for rash.  ?Neurological:  Positive for headaches. Negative for dizziness, syncope, light-headedness and numbness.  ?Hematological:  Negative for adenopathy.  ?Psychiatric/Behavioral:  Positive for dysphoric mood. Negative for agitation, confusion, sleep disturbance and suicidal ideas. The patient is not nervous/anxious.   ? ? ?Objective: ?BP 120/90   Ht 5' 8"  (1.727 m)   Wt 153 lb (69.4 kg)   BMI 23.26 kg/m?  ? ? ?Physical Exam ?Constitutional:   ?   Appearance: She is well-developed.  ?Genitourinary:  ?   Vulva normal.  ?   Right Labia: No rash, tenderness or lesions. ?   Left Labia: No tenderness, lesions or rash. ?   No vaginal discharge, erythema or tenderness.  ? ?   Right Adnexa: not tender and no mass present. ?   Left Adnexa: not tender and no mass present. ?   No cervical friability or polyp.  ?   IUD strings visualized.  ?   Uterus is not enlarged or tender.  ?Breasts: ?   Right: No mass, nipple discharge, skin change or tenderness.  ?   Left: No mass, nipple discharge, skin change or tenderness.  ?Neck:  ?   Thyroid: No thyromegaly.  ?Cardiovascular:  ?   Rate and Rhythm: Normal rate and regular rhythm.  ?   Heart sounds: Normal heart sounds. No murmur heard. ?Pulmonary:  ?   Effort: Pulmonary effort is normal.  ?   Breath sounds: Normal breath sounds.  ?Abdominal:  ?   Palpations: Abdomen is soft.  ?   Tenderness: There is no abdominal tenderness. There is no guarding or rebound.  ?Musculoskeletal:     ?   General: Normal range of motion.  ?   Cervical back: Normal range of motion.  ?Lymphadenopathy:  ?   Cervical: No cervical adenopathy.  ?Neurological:  ?   General: No focal deficit present.  ?   Mental Status: She is alert and oriented to person, place, and time.  ?   Cranial Nerves: No cranial  nerve deficit.  ?Skin: ?   General: Skin is warm and dry.  ?Psychiatric:     ?   Mood and Affect: Mood normal.     ?   Behavior: Behavior normal.     ?   Thought Content: Thought content normal.     ?   Judgment: Judgment normal.  ?Vitals reviewed.  ? ?Assessment/Plan: ?Encounter for annual routine gynecological examination ? ?Cervical cancer screening - Plan: Cytology - PAP ? ?Dysplasia  of cervix, high grade CIN 2 - Plan: Cytology - PAP ? ?Screening for HPV (human papillomavirus) - Plan: Cytology - PAP ? ?Encounter for routine checking of intrauterine contraceptive device (IUD)--IUD strings in cx os. Has 8 yr indication ? ?Encounter for screening mammogram for malignant neoplasm of breast; pt current on mammo ? ?Screening for colon cancer - Plan: Cologuard; colonoscopy/cologuard discussed. Pt elects cologuard. Ref sent. Will f/u with results. ? ?Anxiety and depression - Plan: DULoxetine (CYMBALTA) 60 MG capsule; doing well. Rx RF to [employer]. ? ? ?Meds ordered this encounter  ?Medications  ? DULoxetine (CYMBALTA) 60 MG capsule  ?  Sig: Take 1 capsule (60 mg total) by mouth daily.  ?  Dispense:  90 capsule  ?  Refill:  3  ?  Order Specific Question:   Supervising Provider  ?  AnswerGae Dry [[id number]]  ? ?          ?GYN counsel breast self exam, mammography screening, adequate intake of calcium and vitamin D, diet and exercise ? ? ?  F/U ? Return in about 1 year (around 03/10/2023). ? ?Elhadj Girton B. Aliena Ghrist, PA-C ?03/09/2022 ?10:15 AM ?

## 2022-03-09 ENCOUNTER — Ambulatory Visit (INDEPENDENT_AMBULATORY_CARE_PROVIDER_SITE_OTHER): Payer: BC Managed Care – PPO | Admitting: Obstetrics and Gynecology

## 2022-03-09 ENCOUNTER — Other Ambulatory Visit (HOSPITAL_COMMUNITY)
Admission: RE | Admit: 2022-03-09 | Discharge: 2022-03-09 | Disposition: A | Discharge status: Home / Self Care | Payer: BC Managed Care – PPO | Source: Ambulatory Visit | Attending: Obstetrics and Gynecology | Admitting: Obstetrics and Gynecology

## 2022-03-09 ENCOUNTER — Encounter: Payer: Self-pay | Admitting: Obstetrics and Gynecology

## 2022-03-09 VITALS — BP 120/90 | Ht 68.0 in | Wt 153.0 lb

## 2022-03-09 DIAGNOSIS — Z01419 Encounter for gynecological examination (general) (routine) without abnormal findings: Secondary | ICD-10-CM | POA: Diagnosis not present

## 2022-03-09 DIAGNOSIS — Z1211 Encounter for screening for malignant neoplasm of colon: Secondary | ICD-10-CM

## 2022-03-09 DIAGNOSIS — Z1231 Encounter for screening mammogram for malignant neoplasm of breast: Secondary | ICD-10-CM

## 2022-03-09 DIAGNOSIS — N871 Moderate cervical dysplasia: Secondary | ICD-10-CM | POA: Insufficient documentation

## 2022-03-09 DIAGNOSIS — Z30431 Encounter for routine checking of intrauterine contraceptive device: Secondary | ICD-10-CM

## 2022-03-09 DIAGNOSIS — Z124 Encounter for screening for malignant neoplasm of cervix: Secondary | ICD-10-CM | POA: Diagnosis not present

## 2022-03-09 DIAGNOSIS — Z1151 Encounter for screening for human papillomavirus (HPV): Secondary | ICD-10-CM

## 2022-03-09 DIAGNOSIS — F419 Anxiety disorder, unspecified: Secondary | ICD-10-CM

## 2022-03-09 DIAGNOSIS — F32A Depression, unspecified: Secondary | ICD-10-CM

## 2022-03-09 MED ORDER — DULOXETINE HCL 60 MG PO CPEP: 60.0000 mg | ORAL_CAPSULE | Freq: Every day | ORAL | 3 refills | Status: DC

## 2022-03-09 NOTE — Patient Instructions (Signed)
I value your feedback and you entrusting [place] with your care. If you get a [place] patient survey, I would appreciate you taking the time to let us know about your experience today. Thank you! ? ? ?

## 2022-03-11 LAB — CYTOLOGY - PAP
Comment: NEGATIVE
High risk HPV: POSITIVE — AB

## 2022-03-13 NOTE — Progress Notes (Signed)
Pls call pt to sched with colpo with MD

## 2022-04-04 DIAGNOSIS — Z1211 Encounter for screening for malignant neoplasm of colon: Secondary | ICD-10-CM | POA: Diagnosis not present

## 2022-04-05 ENCOUNTER — Ambulatory Visit: Payer: BC Managed Care – PPO | Admitting: Family Medicine

## 2022-04-15 LAB — COLOGUARD: COLOGUARD: NEGATIVE

## 2022-04-27 ENCOUNTER — Encounter: Payer: Self-pay | Admitting: Family Medicine

## 2022-04-27 ENCOUNTER — Other Ambulatory Visit (HOSPITAL_COMMUNITY)
Admission: RE | Admit: 2022-04-27 | Discharge: 2022-04-27 | Disposition: A | Discharge status: Home / Self Care | Payer: BC Managed Care – PPO | Source: Ambulatory Visit | Attending: Family Medicine | Admitting: Family Medicine

## 2022-04-27 ENCOUNTER — Ambulatory Visit (INDEPENDENT_AMBULATORY_CARE_PROVIDER_SITE_OTHER): Payer: BC Managed Care – PPO | Admitting: Family Medicine

## 2022-04-27 VITALS — BP 122/70 | Ht 68.0 in | Wt 159.0 lb

## 2022-04-27 DIAGNOSIS — R87619 Unspecified abnormal cytological findings in specimens from cervix uteri: Secondary | ICD-10-CM

## 2022-04-27 DIAGNOSIS — N870 Mild cervical dysplasia: Secondary | ICD-10-CM | POA: Diagnosis not present

## 2022-04-27 DIAGNOSIS — N871 Moderate cervical dysplasia: Secondary | ICD-10-CM

## 2022-04-27 DIAGNOSIS — R87612 Low grade squamous intraepithelial lesion on cytologic smear of cervix (LGSIL): Secondary | ICD-10-CM | POA: Diagnosis not present

## 2022-04-27 DIAGNOSIS — N72 Inflammatory disease of cervix uteri: Secondary | ICD-10-CM | POA: Diagnosis not present

## 2022-04-27 LAB — POCT URINE PREGNANCY: Preg Test, Ur: NEGATIVE

## 2022-04-27 NOTE — Progress Notes (Signed)
     GYNECOLOGY OFFICE COLPOSCOPY PROCEDURE NOTE  Subjective:   Rachel Patrick is a 46 y.o. [id number] female here for a problem GYN visit.  Current complaints: Here for colpo due to LSIL on pap. Patient has not had HPV vaccines. She reports history of abnormal pap with LEEP. I reviewed this history independently in EPIC.     Denies abnormal vaginal bleeding, discharge, pelvic pain, problems with intercourse or other gynecologic concerns.    Gynecologic History No LMP recorded. (Menstrual status: IUD).  Contraception: IUD  Health Maintenance Due  Topic Date Due   COVID-19 Vaccine (1) Never done   HIV Screening  Never done   Hepatitis C Screening  Never done   COLONOSCOPY (Pts 45-82yr Insurance coverage will need to be confirmed)  Never done    The following portions of the patient's history were reviewed and updated as appropriate: allergies, current medications, past family history, past medical history, past social history, past surgical history and problem list.  Review of Systems Pertinent items are noted in HPI.   Objective:  BP 122/70   Ht 5' 8"  (1.727 m)   Wt 159 lb (72.1 kg)   BMI 24.18 kg/m  Gen: well appearing, NAD HEENT: no scleral icterus CV: RR Lung: Normal WOB Ext: warm well perfused  PELVIC: Normal appearing external genitalia; normal appearing vaginal mucosa and cervix.  No abnormal discharge noted.  IUD strings seen.  Physical Exam Genitourinary:        Patient gave informed written consent, time out was performed.  Placed in lithotomy position. Cervix viewed with speculum and colposcope after application of acetic acid.   Colposcopy adequate? Yes Acetowhite lesion(s) noted mostly on the right endocervical area from 12-6 o'clock but also light/hazy patches circumferentially. Most dense region at 1  o'clock; corresponding biopsies obtained.  ECC specimen obtained. All specimens were labeled and sent to pathology.  Chaperone was present during  entire procedure.  Patient was given post procedure instructions.  Will follow up pathology and manage accordingly; patient will be contacted with results and recommendations.  Routine preventative health maintenance measures emphasized.  Assessment and Plan:  1. Dysplasia of cervix, high grade CIN 2 - POCT urine pregnancy  2. Abnormal cervical Papanicolaou smear, unspecified abnormal pap finding Here for Colposcopy for low-grade squamous intraepithelial neoplasia (LGSIL - encompassing HPV,mild dysplasia,CIN I) pap smear on 02/2022. Discussed role for HPV in cervical dysplasia, need for surveillance.  3. 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   No follow-ups on file.  KCaren Macadam MD, MPH, ABFM Attending [facility]

## 2022-04-28 LAB — SURGICAL PATHOLOGY

## 2022-04-28 NOTE — Progress Notes (Signed)
Pt aware, pt put on hold to transfer to schedule appointment for HPV vaccine. Pt hung up after waiting. Will have the schedulers call her back to schedule.

## 2023-03-23 ENCOUNTER — Encounter: Payer: Self-pay | Admitting: Obstetrics and Gynecology

## 2023-03-23 DIAGNOSIS — Z1231 Encounter for screening mammogram for malignant neoplasm of breast: Secondary | ICD-10-CM

## 2023-04-04 NOTE — Progress Notes (Unsigned)
PCP:  Rica Records, PA-C   No chief complaint on file.    HPI:      Ms. Rachel Patrick is a 47 y.o. [phone] who LMP was No LMP recorded. (Menstrual status: IUD)., presents today for her annual examination.  Her menses are absent due to IUD; no BTB. Dysmenorrhea none. Occas vasomotor sx.   Sex activity: single partner, contraception - IUD. Mirena placed 05/05/17. No pain/bleeding.  Last Pap: 03/09/22 Results were LGSIL with POS HPV DNA: 04/27/22 Colpo/bx with LGSIL; repeat pap due today 12/25/19, 12/28/18 and 7/19  Results were: no abnormalities /neg HPV DNA.  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 S/p LEEP 12/18 with CIN 1/2 with Dr. Bonney Aid.  Hx of STDs: HPV  Last mammogram: 03/02/22  Results were: normal--routine follow-up in 12 months There is no FH of breast cancer. There is no FH of ovarian cancer. The patient does self-breast exams.  Tobacco use: The patient denies current or previous tobacco use. Alcohol use: few drinks wkly No drug use.  Exercise: min active  Colonoscopy: never; 04/04/22 NEG Cologuard, repeat due after 3 yrs.   She does get adequate calcium and Vitamin D in her diet.  She has issues with anxiety/depression and doing well on cymbalta 60 mg daily. Does have decreased libido as side effect but doesn't want to change med. Did zoloft and effexor in distant past without relief. Takes xanax rarely   Normal fasting labs 4/23  Past Medical History:  Diagnosis Date   Depression    Dysplasia of cervix, high grade CIN 2 2019   Dysplasia of cervix, high grade CIN 2 12/25/2019   History of mammogram 08/12/2013; 11 08-17   BIRAD I; NEG   History of Papanicolaou smear of cervix 08/26/2014   -/-   Insomnia    Migraine     Past Surgical History:  Procedure Laterality Date   INTRAUTERINE DEVICE (IUD) INSERTION  05/10/2012   LEEP  10/2017   Dr. Bonney Aid    Family History  Problem Relation Age of Onset   Uterine cancer Mother 68   Parkinson's disease Father     Cancer Father 34       PROSTATE   Non-Hodgkin's lymphoma Father        Low grade   Heart attack Brother 20   Breast cancer Neg Hx     Social History   Socioeconomic History   Marital status: Married    Spouse name: Not on file   Number of children: 2   Years of education: 16   Highest education level: Not on file  Occupational History   Occupation: ACTIVITIES COORDINATOR    Comment: [employer]  Tobacco Use   Smoking status: Former   Smokeless tobacco: Never   Tobacco comments:    QUIT 2008  Vaping Use   Vaping Use: Never used  Substance and Sexual Activity   Alcohol use: Yes    Comment: RARELY   Drug use: No   Sexual activity: Yes    Birth control/protection: I.U.D.    Comment: Mirena  Other Topics Concern   Not on file  Social History Narrative   Not on file   Social Determinants of Health   Financial Resource Strain: Not on file  Food Insecurity: Not on file  Transportation Needs: Not on file  Physical Activity: Not on file  Stress: Not on file  Social Connections: Not on file  Intimate Partner Violence: Not on file    No outpatient  medications have been marked as taking for the 04/05/23 encounter (Appointment) with Mccoy Testa, Ilona Sorrel, PA-C.     ROS:  Review of Systems  Constitutional:  Negative for fatigue, fever and unexpected weight change.  Respiratory:  Negative for cough, shortness of breath and wheezing.   Cardiovascular:  Negative for chest pain, palpitations and leg swelling.  Gastrointestinal:  Positive for nausea. Negative for blood in stool, constipation, diarrhea and vomiting.  Endocrine: Negative for cold intolerance, heat intolerance and polyuria.  Genitourinary:  Negative for dyspareunia, dysuria, flank pain, frequency, genital sores, hematuria, menstrual problem, pelvic pain, urgency, vaginal bleeding, vaginal discharge and vaginal pain.  Musculoskeletal:  Negative for back pain, joint swelling and myalgias.  Skin:  Negative for  rash.  Neurological:  Positive for headaches. Negative for dizziness, syncope, light-headedness and numbness.  Hematological:  Negative for adenopathy.  Psychiatric/Behavioral:  Positive for dysphoric mood. Negative for agitation, confusion, sleep disturbance and suicidal ideas. The patient is not nervous/anxious.      Objective: There were no vitals taken for this visit.   Physical Exam Constitutional:      Appearance: She is well-developed.  Genitourinary:     Vulva normal.     Right Labia: No rash, tenderness or lesions.    Left Labia: No tenderness, lesions or rash.    No vaginal discharge, erythema or tenderness.      Right Adnexa: not tender and no mass present.    Left Adnexa: not tender and no mass present.    No cervical friability or polyp.     IUD strings visualized.     Uterus is not enlarged or tender.  Breasts:    Right: No mass, nipple discharge, skin change or tenderness.     Left: No mass, nipple discharge, skin change or tenderness.  Neck:     Thyroid: No thyromegaly.  Cardiovascular:     Rate and Rhythm: Normal rate and regular rhythm.     Heart sounds: Normal heart sounds. No murmur heard. Pulmonary:     Effort: Pulmonary effort is normal.     Breath sounds: Normal breath sounds.  Abdominal:     Palpations: Abdomen is soft.     Tenderness: There is no abdominal tenderness. There is no guarding or rebound.  Musculoskeletal:        General: Normal range of motion.     Cervical back: Normal range of motion.  Lymphadenopathy:     Cervical: No cervical adenopathy.  Neurological:     General: No focal deficit present.     Mental Status: She is alert and oriented to person, place, and time.     Cranial Nerves: No cranial nerve deficit.  Skin:    General: Skin is warm and dry.  Psychiatric:        Mood and Affect: Mood normal.        Behavior: Behavior normal.        Thought Content: Thought content normal.        Judgment: Judgment normal.  Vitals  reviewed.    Assessment/Plan: Encounter for annual routine gynecological examination  Cervical cancer screening - Plan: Cytology - PAP  Dysplasia of cervix, high grade CIN 2 - Plan: Cytology - PAP  Screening for HPV (human papillomavirus) - Plan: Cytology - PAP  Encounter for routine checking of intrauterine contraceptive device (IUD)--IUD strings in cx os. Has 8 yr indication  Encounter for screening mammogram for malignant neoplasm of breast; pt current on mammo  Screening for colon  cancer - Plan: Cologuard; colonoscopy/cologuard discussed. Pt elects cologuard. Ref sent. Will f/u with results.  Anxiety and depression - Plan: DULoxetine (CYMBALTA) 60 MG capsule; doing well. Rx RF to [employer] [facility].   No orders of the defined types were placed in this encounter.            GYN counsel breast self exam, mammography screening, adequate intake of calcium and vitamin D, diet and exercise     F/U  No follow-ups on file.  Jaislyn Blinn B. Stefano Trulson, PA-C 04/04/2023 5:08 PM

## 2023-04-05 ENCOUNTER — Other Ambulatory Visit (HOSPITAL_COMMUNITY)
Admission: RE | Admit: 2023-04-05 | Discharge: 2023-04-05 | Disposition: A | Discharge status: Home / Self Care | Payer: BC Managed Care – PPO | Source: Ambulatory Visit | Attending: Obstetrics and Gynecology | Admitting: Obstetrics and Gynecology

## 2023-04-05 ENCOUNTER — Ambulatory Visit (INDEPENDENT_AMBULATORY_CARE_PROVIDER_SITE_OTHER): Payer: BC Managed Care – PPO | Admitting: Obstetrics and Gynecology

## 2023-04-05 ENCOUNTER — Encounter: Payer: Self-pay | Admitting: Obstetrics and Gynecology

## 2023-04-05 VITALS — BP 110/70 | Ht 68.0 in | Wt 171.0 lb

## 2023-04-05 DIAGNOSIS — Z1151 Encounter for screening for human papillomavirus (HPV): Secondary | ICD-10-CM | POA: Diagnosis not present

## 2023-04-05 DIAGNOSIS — Z124 Encounter for screening for malignant neoplasm of cervix: Secondary | ICD-10-CM

## 2023-04-05 DIAGNOSIS — Z30431 Encounter for routine checking of intrauterine contraceptive device: Secondary | ICD-10-CM

## 2023-04-05 DIAGNOSIS — Z23 Encounter for immunization: Secondary | ICD-10-CM

## 2023-04-05 DIAGNOSIS — N871 Moderate cervical dysplasia: Secondary | ICD-10-CM | POA: Diagnosis not present

## 2023-04-05 DIAGNOSIS — Z1231 Encounter for screening mammogram for malignant neoplasm of breast: Secondary | ICD-10-CM

## 2023-04-05 DIAGNOSIS — F419 Anxiety disorder, unspecified: Secondary | ICD-10-CM

## 2023-04-05 DIAGNOSIS — Z01419 Encounter for gynecological examination (general) (routine) without abnormal findings: Secondary | ICD-10-CM | POA: Diagnosis not present

## 2023-04-05 MED ORDER — DULOXETINE HCL 60 MG PO CPEP: 60.0000 mg | ORAL_CAPSULE | Freq: Every day | ORAL | 3 refills | Status: DC

## 2023-04-05 NOTE — Patient Instructions (Signed)
I value your feedback and you entrusting us with your care. If you get a [place] patient survey, I would appreciate you taking the time to let us know about your experience today. Thank you! ? ? ?

## 2023-04-06 ENCOUNTER — Ambulatory Visit
Admission: RE | Admit: 2023-04-06 | Discharge: 2023-04-06 | Disposition: A | Discharge status: Home / Self Care | Payer: BC Managed Care – PPO | Source: Ambulatory Visit | Attending: Obstetrics and Gynecology | Admitting: Obstetrics and Gynecology

## 2023-04-06 DIAGNOSIS — Z1231 Encounter for screening mammogram for malignant neoplasm of breast: Secondary | ICD-10-CM | POA: Insufficient documentation

## 2023-04-07 LAB — CYTOLOGY - PAP
Comment: NEGATIVE
Diagnosis: UNDETERMINED — AB
High risk HPV: NEGATIVE

## 2023-04-14 ENCOUNTER — Telehealth: Payer: Self-pay

## 2023-04-14 DIAGNOSIS — F32A Depression, unspecified: Secondary | ICD-10-CM

## 2023-04-14 MED ORDER — DULOXETINE HCL 60 MG PO CPEP: 60.0000 mg | ORAL_CAPSULE | Freq: Every day | ORAL | 3 refills | Status: DC

## 2023-04-14 NOTE — Telephone Encounter (Signed)
Chart reviewed. Rx for Cymbalta sent 04/05/23 to [id number] with 3 rf's. Left voicemail to return call. Need to verify pharmacy and clear this request.

## 2023-04-15 NOTE — Telephone Encounter (Signed)
Left voicemail. Awaiting return call to verify pharmacy/clear this refill request. Advised sending my chart message.

## 2023-06-07 ENCOUNTER — Ambulatory Visit: Payer: BC Managed Care – PPO

## 2024-02-08 DIAGNOSIS — R0981 Nasal congestion: Secondary | ICD-10-CM | POA: Diagnosis not present

## 2024-02-08 DIAGNOSIS — J208 Acute bronchitis due to other specified organisms: Secondary | ICD-10-CM | POA: Diagnosis not present

## 2024-02-08 DIAGNOSIS — R0789 Other chest pain: Secondary | ICD-10-CM | POA: Diagnosis not present

## 2024-02-08 DIAGNOSIS — R051 Acute cough: Secondary | ICD-10-CM | POA: Diagnosis not present

## 2024-08-14 ENCOUNTER — Other Ambulatory Visit: Payer: Self-pay | Admitting: Medical Genetics

## 2024-08-14 ENCOUNTER — Other Ambulatory Visit: Payer: Self-pay | Admitting: Obstetrics and Gynecology

## 2024-08-14 DIAGNOSIS — Z1231 Encounter for screening mammogram for malignant neoplasm of breast: Secondary | ICD-10-CM

## 2024-08-17 ENCOUNTER — Other Ambulatory Visit

## 2024-08-21 ENCOUNTER — Other Ambulatory Visit
Admission: RE | Admit: 2024-08-21 | Discharge: 2024-08-21 | Disposition: A | Discharge status: Home / Self Care | Payer: Self-pay | Source: Ambulatory Visit | Attending: Medical Genetics | Admitting: Medical Genetics

## 2024-08-28 DIAGNOSIS — H25011 Cortical age-related cataract, right eye: Secondary | ICD-10-CM | POA: Diagnosis not present

## 2024-08-28 DIAGNOSIS — H269 Unspecified cataract: Secondary | ICD-10-CM

## 2024-08-28 DIAGNOSIS — H5213 Myopia, bilateral: Secondary | ICD-10-CM | POA: Diagnosis not present

## 2024-08-28 DIAGNOSIS — H2513 Age-related nuclear cataract, bilateral: Secondary | ICD-10-CM | POA: Diagnosis not present

## 2024-08-28 DIAGNOSIS — H43393 Other vitreous opacities, bilateral: Secondary | ICD-10-CM | POA: Diagnosis not present

## 2024-08-28 HISTORY — DX: Unspecified cataract: H26.9

## 2024-08-29 ENCOUNTER — Ambulatory Visit
Admission: RE | Admit: 2024-08-29 | Discharge: 2024-08-29 | Disposition: A | Discharge status: Home / Self Care | Source: Ambulatory Visit | Attending: Obstetrics and Gynecology | Admitting: Obstetrics and Gynecology

## 2024-08-29 DIAGNOSIS — Z1231 Encounter for screening mammogram for malignant neoplasm of breast: Secondary | ICD-10-CM | POA: Insufficient documentation

## 2024-08-30 ENCOUNTER — Ambulatory Visit: Admitting: Obstetrics and Gynecology

## 2024-08-30 ENCOUNTER — Encounter: Payer: Self-pay | Admitting: Obstetrics and Gynecology

## 2024-08-30 ENCOUNTER — Other Ambulatory Visit (HOSPITAL_COMMUNITY)
Admission: RE | Admit: 2024-08-30 | Discharge: 2024-08-30 | Disposition: A | Source: Ambulatory Visit | Attending: Obstetrics and Gynecology | Admitting: Obstetrics and Gynecology

## 2024-08-30 VITALS — BP 128/85 | HR 120 | Ht 68.0 in | Wt 166.0 lb

## 2024-08-30 DIAGNOSIS — Z1151 Encounter for screening for human papillomavirus (HPV): Secondary | ICD-10-CM

## 2024-08-30 DIAGNOSIS — N951 Menopausal and female climacteric states: Secondary | ICD-10-CM

## 2024-08-30 DIAGNOSIS — Z1329 Encounter for screening for other suspected endocrine disorder: Secondary | ICD-10-CM

## 2024-08-30 DIAGNOSIS — Z131 Encounter for screening for diabetes mellitus: Secondary | ICD-10-CM

## 2024-08-30 DIAGNOSIS — Z13220 Encounter for screening for lipoid disorders: Secondary | ICD-10-CM

## 2024-08-30 DIAGNOSIS — Z1231 Encounter for screening mammogram for malignant neoplasm of breast: Secondary | ICD-10-CM

## 2024-08-30 DIAGNOSIS — R7989 Other specified abnormal findings of blood chemistry: Secondary | ICD-10-CM

## 2024-08-30 DIAGNOSIS — Z124 Encounter for screening for malignant neoplasm of cervix: Secondary | ICD-10-CM | POA: Insufficient documentation

## 2024-08-30 DIAGNOSIS — N871 Moderate cervical dysplasia: Secondary | ICD-10-CM

## 2024-08-30 DIAGNOSIS — F419 Anxiety disorder, unspecified: Secondary | ICD-10-CM

## 2024-08-30 DIAGNOSIS — Z0000 Encounter for general adult medical examination without abnormal findings: Secondary | ICD-10-CM | POA: Diagnosis not present

## 2024-08-30 DIAGNOSIS — Z01419 Encounter for gynecological examination (general) (routine) without abnormal findings: Secondary | ICD-10-CM | POA: Diagnosis not present

## 2024-08-30 DIAGNOSIS — F32A Depression, unspecified: Secondary | ICD-10-CM

## 2024-08-30 DIAGNOSIS — Z30431 Encounter for routine checking of intrauterine contraceptive device: Secondary | ICD-10-CM

## 2024-08-30 NOTE — Patient Instructions (Signed)
 I value your feedback and you entrusting [place] with your care. If you get a [place] patient survey, I would appreciate you taking the time to let us know about your experience today. Thank you! ? ? ?

## 2024-08-30 NOTE — Progress Notes (Addendum)
 PCP:  Watt Bernarda NOVAK, PA-C   Chief Complaint  Patient presents with   Gynecologic Exam    Hot flashes, trouble sleeping/staying asleep, irritability and anxiety x 1 year. Would like mental health referral.     HPI:      Rachel Patrick is a 48 y.o. [id number] who LMP was No LMP recorded. (Menstrual status: IUD)., presents today for her annual examination.  Her menses are absent due to IUD; no BTB/pelvic pain. Dysmenorrhea none. Has tolerable vasomotor sx and trouble staying asleep.   Hx of anxiety and depression in the past, was on cymbalta . Weaned off a few months ago because was too stoic/decreased motivation. Anxiety has remained the same since being off cymbalta  so questions whether it really helped it or not. Anxiety intermittent daily, has occas panic attacks. No significant depression sx. Would like to see therapist; declines meds for now. Did sertraline and effexor  in past as well.  No recent labs.   Sex activity: single partner, contraception - IUD. Mirena  placed 05/05/17. No pain/bleeding/dryness.   Last Pap: 04/05/23 Results were ASCUS/NEG HPV DNA; repeat due today. 03/09/22 Results were LGSIL with POS HPV DNA: 04/27/22 Colpo/bx with LGSIL 12/25/19, 12/28/18 and 7/19  Results were: no abnormalities /neg HPV DNA.  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 S/p LEEP 12/18 with CIN 1/2 with Dr. Lake.  Hx of STDs: HPV  Last mammogram: 04/07/23  Results were: normal--routine follow-up in 12 months; had mammo yesterday, still awaiting results There is no FH of breast cancer. There is no FH of ovarian cancer. The patient does self-breast exams.  Tobacco use: none Alcohol use: few drinks wkly No drug use.  Exercise: mod active  Colonoscopy: never; 04/04/22 NEG Cologuard, repeat due after 3 yrs.   She does get adequate calcium and Vitamin D  in her diet.  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 Lake    Family History  Problem Relation Age of Onset   Uterine cancer Mother 57   Parkinson's disease Father    Cancer Father 37       PROSTATE   Non-Hodgkin's lymphoma Father        Low grade   Heart attack Brother 37   Breast cancer Neg Hx     Social History   Socioeconomic History   Marital status: Married    Spouse name: Not on file   Number of children: 2   Years of education: 16   Highest education level: Not on file  Occupational History   Occupation: ACTIVITIES COORDINATOR    Comment: [employer]  Tobacco Use   Smoking status: Former   Smokeless tobacco: Never   Tobacco comments:    QUIT 2008  Vaping Use   Vaping status: Every Day  Substance and Sexual Activity   Alcohol use: Yes    Comment: RARELY   Drug use: No   Sexual activity: Yes    Birth control/protection: I.U.D.    Comment:  Mirena   Other Topics Concern   Not on file  Social History Narrative   Not on file   Social Drivers of Health   Financial Resource Strain: Not on file  Food Insecurity: Not on file  Transportation Needs: Not on file  Physical Activity: Not on file  Stress: Not on file  Social Connections: Not on file  Intimate Partner Violence: Not on file    Current Meds  Medication Sig   albuterol  (VENTOLIN  HFA) 108 (90 Base) MCG/ACT inhaler 1-2 puffs every 4-6 hours as needed for chest tightness/wheezing.   levonorgestrel  (MIRENA ) 20 MCG/24HR IUD 1 Intra Uterine Device (1 each total) by Intrauterine route once.   Spacer/Aero-Holding Raguel FRENCH See admin instructions.     ROS:  Review of Systems  Constitutional:  Negative for  fatigue, fever and unexpected weight change.  Respiratory:  Negative for cough, shortness of breath and wheezing.   Cardiovascular:  Negative for chest pain, palpitations and leg swelling.  Gastrointestinal:  Negative for blood in stool, constipation, diarrhea, nausea and vomiting.  Endocrine: Negative for cold intolerance, heat intolerance and polyuria.  Genitourinary:  Negative for dyspareunia, dysuria, flank pain, frequency, genital sores, hematuria, menstrual problem, pelvic pain, urgency, vaginal bleeding, vaginal discharge and vaginal pain.  Musculoskeletal:  Negative for back pain, joint swelling and myalgias.  Skin:  Negative for rash.  Neurological:  Negative for dizziness, syncope, light-headedness, numbness and headaches.  Hematological:  Negative for adenopathy.  Psychiatric/Behavioral:  Positive for agitation. Negative for confusion, dysphoric mood, sleep disturbance and suicidal ideas. The patient is not nervous/anxious.      Objective: BP 128/85   Pulse (!) 120   Ht 5' 8 (1.727 m)   Wt 166 lb (75.3 kg)   BMI 25.24 kg/m    Physical Exam Constitutional:      Appearance: She is well-developed.  Genitourinary:     Vulva normal.     Right Labia: No rash, tenderness or lesions.    Left Labia: No tenderness, lesions or rash.    No vaginal discharge, erythema or tenderness.      Right Adnexa: not tender and no mass present.    Left Adnexa: not tender and no mass present.    No cervical friability or polyp.     IUD strings visualized.     Uterus is not enlarged or tender.  Breasts:    Right: No mass, nipple discharge, skin change or tenderness.     Left: No mass, nipple discharge, skin change or tenderness.  Neck:     Thyroid : No thyromegaly.  Cardiovascular:     Rate and Rhythm: Normal rate and regular rhythm.     Heart sounds: Normal heart sounds. No murmur heard. Pulmonary:     Effort: Pulmonary effort is normal.     Breath sounds: Normal breath sounds.   Abdominal:     Palpations: Abdomen is soft.     Tenderness: There is no abdominal tenderness. There is no guarding or rebound.  Musculoskeletal:        General: Normal range of motion.     Cervical back: Normal range of motion.  Lymphadenopathy:     Cervical: No cervical adenopathy.  Neurological:     General: No focal deficit present.     Mental Status: She is alert and oriented to person, place, and time.     Cranial Nerves: No cranial nerve deficit.  Skin:    General: Skin is warm and dry.  Psychiatric:  Mood and Affect: Mood normal.        Behavior: Behavior normal.        Thought Content: Thought content normal.        Judgment: Judgment normal.  Vitals reviewed.       08/30/2024    2:24 PM 12/05/2020    8:35 AM 12/25/2019    3:29 PM 01/17/2019    4:41 PM  GAD 7 : Generalized Anxiety Score  Nervous, Anxious, on Edge 3 1 1 1   Control/stop worrying 0 0 0 1  Worry too much - different things 1 0 1 1  Trouble relaxing 0 1 1 1   Restless 1 0 0 0  Easily annoyed or irritable 1 0 2 0  Afraid - awful might happen 0 0 0 0  Total GAD 7 Score 6 2 5 4   Anxiety Difficulty  Somewhat difficult Not difficult at all Somewhat difficult      08/30/2024    2:23 PM  Depression screen PHQ 2/9  Decreased Interest 0  Down, Depressed, Hopeless 0  PHQ - 2 Score 0  Altered sleeping 2  Tired, decreased energy 1  Change in appetite 2  Feeling bad or failure about yourself  0  Trouble concentrating 2  Moving slowly or fidgety/restless 0  Suicidal thoughts 0  PHQ-9 Score 7  Difficult doing work/chores Somewhat difficult    Assessment/Plan: Encounter for annual routine gynecological examination  Cervical cancer screening - Plan: Cytology - PAP  Screening for HPV (human papillomavirus) - Plan: Cytology - PAP  Dysplasia of cervix, high grade CIN 2 - Plan: Cytology - PAP; repeat pap today, will f/u if abn.   Encounter for routine checking of intrauterine contraceptive device  (IUD)--IUD strings in cx os, has 8 yr indication. Will remove next yr.  Encounter for screening mammogram for malignant neoplasm of breast; pt had mammo yesterday  Anxiety and depression--declines tx. Discussed buspar vs hydroxyzine since pt not interested in SSRI/cymbalta . Recommended therapist, doesn't need ref. Add Vit D. F/u prn.   Perimenopausal symptoms - Plan: FSH/LH, Estradiol, TSH, T4, free, CBC with Differential/Platelet, Comprehensive metabolic panel with GFR; check labs.  Blood tests for routine general physical examination - Plan: FSH/LH, Estradiol, TSH, T4, free, CBC with Differential/Platelet, Comprehensive metabolic panel with GFR  Thyroid  disorder screening - Plan: TSH, T4, free         GYN counsel breast self exam, mammography screening, adequate intake of calcium and vitamin D , diet and exercise     F/U  Return in about 1 year (around 08/30/2025).  Kramer Hanrahan B. Demia Viera, PA-C 08/30/2024 2:34 PM

## 2024-08-31 ENCOUNTER — Encounter (INDEPENDENT_AMBULATORY_CARE_PROVIDER_SITE_OTHER): Payer: Self-pay

## 2024-08-31 DIAGNOSIS — H40003 Preglaucoma, unspecified, bilateral: Secondary | ICD-10-CM | POA: Diagnosis not present

## 2024-08-31 DIAGNOSIS — H538 Other visual disturbances: Secondary | ICD-10-CM | POA: Diagnosis not present

## 2024-08-31 DIAGNOSIS — H2513 Age-related nuclear cataract, bilateral: Secondary | ICD-10-CM | POA: Diagnosis not present

## 2024-08-31 DIAGNOSIS — H35371 Puckering of macula, right eye: Secondary | ICD-10-CM | POA: Diagnosis not present

## 2024-08-31 LAB — COMPREHENSIVE METABOLIC PANEL WITH GFR
ALT: 30 IU/L (ref 0–32)
AST: 95 IU/L — ABNORMAL HIGH (ref 0–40)
Albumin: 4.5 g/dL (ref 3.9–4.9)
Alkaline Phosphatase: 76 IU/L (ref 41–116)
BUN/Creatinine Ratio: 8 — ABNORMAL LOW (ref 9–23)
BUN: 7 mg/dL (ref 6–24)
Bilirubin Total: 0.5 mg/dL (ref 0.0–1.2)
CO2: 21 mmol/L (ref 20–29)
Calcium: 9.7 mg/dL (ref 8.7–10.2)
Chloride: 102 mmol/L (ref 96–106)
Creatinine, Ser: 0.93 mg/dL (ref 0.57–1.00)
Globulin, Total: 2.9 g/dL (ref 1.5–4.5)
Glucose: 124 mg/dL — ABNORMAL HIGH (ref 70–99)
Potassium: 3.6 mmol/L (ref 3.5–5.2)
Sodium: 140 mmol/L (ref 134–144)
Total Protein: 7.4 g/dL (ref 6.0–8.5)
eGFR: 76 mL/min/1.73 (ref >59)

## 2024-08-31 LAB — CBC WITH DIFFERENTIAL/PLATELET
Basophils Absolute: 0.1 x10E3/uL (ref 0.0–0.2)
Basos: 1 %
EOS (ABSOLUTE): 0.1 x10E3/uL (ref 0.0–0.4)
Eos: 1 %
Hematocrit: 39.9 % (ref 34.0–46.6)
Hemoglobin: 12.7 g/dL (ref 11.1–15.9)
Immature Grans (Abs): 0 x10E3/uL (ref 0.0–0.1)
Immature Granulocytes: 0 %
Lymphocytes Absolute: 0.8 x10E3/uL (ref 0.7–3.1)
Lymphs: 15 %
MCH: 30.5 pg (ref 26.6–33.0)
MCHC: 31.8 g/dL (ref 31.5–35.7)
MCV: 96 fL (ref 79–97)
Monocytes Absolute: 0.5 x10E3/uL (ref 0.1–0.9)
Monocytes: 9 %
Neutrophils Absolute: 4 x10E3/uL (ref 1.4–7.0)
Neutrophils: 74 %
Platelets: 364 x10E3/uL (ref 150–450)
RBC: 4.16 x10E6/uL (ref 3.77–5.28)
RDW: 14.3 % (ref 11.7–15.4)
WBC: 5.4 x10E3/uL (ref 3.4–10.8)

## 2024-08-31 LAB — T4, FREE: Free T4: 1.18 ng/dL (ref 0.82–1.77)

## 2024-08-31 LAB — FSH/LH
FSH: 80.9 m[IU]/mL
LH: 38.3 m[IU]/mL

## 2024-08-31 LAB — TSH: TSH: 0.694 u[IU]/mL (ref 0.450–4.500)

## 2024-08-31 LAB — ESTRADIOL: Estradiol: 9.4 pg/mL

## 2024-09-02 ENCOUNTER — Ambulatory Visit: Payer: Self-pay | Admitting: Obstetrics and Gynecology

## 2024-09-03 ENCOUNTER — Ambulatory Visit: Payer: Self-pay | Admitting: Obstetrics and Gynecology

## 2024-09-03 ENCOUNTER — Other Ambulatory Visit: Payer: Self-pay | Admitting: Obstetrics and Gynecology

## 2024-09-03 LAB — CYTOLOGY - PAP
Comment: NEGATIVE
Diagnosis: NEGATIVE
High risk HPV: NEGATIVE

## 2024-09-03 MED ORDER — HYDROXYZINE HCL 25 MG PO TABS: 25.0000 mg | ORAL_TABLET | Freq: Four times a day (QID) | ORAL | 0 refills | Status: AC | PRN

## 2024-09-03 NOTE — Addendum Note (Signed)
 Addended by: WATT HILA B on: 09/03/2024 08:57 AM   Modules accepted: Orders

## 2024-09-03 NOTE — Progress Notes (Signed)
 Rx hydroxyzine prn anxiety

## 2024-09-07 LAB — GENECONNECT MOLECULAR SCREEN: Genetic Analysis Overall Interpretation: NEGATIVE

## 2024-09-24 DIAGNOSIS — H2511 Age-related nuclear cataract, right eye: Secondary | ICD-10-CM | POA: Diagnosis not present

## 2024-10-09 ENCOUNTER — Encounter: Payer: Self-pay | Admitting: Ophthalmology

## 2024-10-10 NOTE — Anesthesia Preprocedure Evaluation (Addendum)
 Anesthesia Evaluation    Airway Mallampati: III  TM Distance: >3 FB Neck ROM: Full    Dental  (+) Caps   Pulmonary former smoker   Pulmonary exam normal breath sounds clear to auscultation       Cardiovascular Normal cardiovascular exam Rhythm:Regular Rate:Normal     Neuro/Psych  Headaches PSYCHIATRIC DISORDERS Anxiety Depression       GI/Hepatic   Endo/Other    Renal/GU      Musculoskeletal   Abdominal   Peds  Hematology   Anesthesia Other Findings Depression  Insomnia Migraine Cataract Anxiety     Reproductive/Obstetrics                              Anesthesia Physical Anesthesia Plan  ASA: 2  Anesthesia Plan: MAC   Post-op Pain Management:    Induction: Intravenous  PONV Risk Score and Plan:   Airway Management Planned: Natural Airway and Nasal Cannula  Additional Equipment:   Intra-op Plan:   Post-operative Plan:   Informed Consent: I have reviewed the patients History and Physical, chart, labs and discussed the procedure including the risks, benefits and alternatives for the proposed anesthesia with the patient or authorized representative who has indicated his/her understanding and acceptance.     Dental Advisory Given  Plan Discussed with: Anesthesiologist, CRNA and Surgeon  Anesthesia Plan Comments: (Patient consented for risks of anesthesia including but not limited to:  - adverse reactions to medications - damage to eyes, teeth, lips or other oral mucosa - nerve damage due to positioning  - sore throat or hoarseness - Damage to heart, brain, nerves, lungs, other parts of body or loss of life  Patient voiced understanding and assent.)         Anesthesia Quick Evaluation

## 2024-10-10 NOTE — Discharge Instructions (Signed)

## 2024-10-11 ENCOUNTER — Encounter
Admission: RE | Disposition: A | Discharge status: Home / Self Care | Payer: Self-pay | Source: Home / Self Care | Attending: Ophthalmology

## 2024-10-11 ENCOUNTER — Ambulatory Visit: Admitting: Anesthesiology

## 2024-10-11 ENCOUNTER — Ambulatory Visit
Admission: RE | Admit: 2024-10-11 | Discharge: 2024-10-11 | Disposition: A | Discharge status: Home / Self Care | Attending: Ophthalmology | Admitting: Ophthalmology

## 2024-10-11 ENCOUNTER — Encounter: Payer: Self-pay | Admitting: Ophthalmology

## 2024-10-11 ENCOUNTER — Other Ambulatory Visit: Payer: Self-pay

## 2024-10-11 DIAGNOSIS — F418 Other specified anxiety disorders: Secondary | ICD-10-CM | POA: Insufficient documentation

## 2024-10-11 DIAGNOSIS — H2511 Age-related nuclear cataract, right eye: Secondary | ICD-10-CM | POA: Diagnosis not present

## 2024-10-11 DIAGNOSIS — G4700 Insomnia, unspecified: Secondary | ICD-10-CM | POA: Diagnosis not present

## 2024-10-11 DIAGNOSIS — F17290 Nicotine dependence, other tobacco product, uncomplicated: Secondary | ICD-10-CM | POA: Insufficient documentation

## 2024-10-11 DIAGNOSIS — G43909 Migraine, unspecified, not intractable, without status migrainosus: Secondary | ICD-10-CM | POA: Diagnosis not present

## 2024-10-11 HISTORY — DX: Anxiety disorder, unspecified: F41.9

## 2024-10-11 LAB — POCT PREGNANCY, URINE: Preg Test, Ur: NEGATIVE

## 2024-10-11 SURGERY — PHACOEMULSIFICATION, CATARACT, WITH IOL INSERTION
Anesthesia: Monitor Anesthesia Care | Site: Eye | Laterality: Right

## 2024-10-11 MED ORDER — PHENYLEPHRINE HCL 10 % OP SOLN
1.0000 [drp] | OPHTHALMIC | Status: AC
  Administered 2024-10-11 (×3): 1 [drp] via OPHTHALMIC

## 2024-10-11 MED ORDER — TETRACAINE HCL 0.5 % OP SOLN
1.0000 [drp] | OPHTHALMIC | Status: DC | PRN
  Administered 2024-10-11 (×3): 1 [drp] via OPHTHALMIC

## 2024-10-11 MED ORDER — CYCLOPENTOLATE HCL 2 % OP SOLN
OPHTHALMIC | Status: AC
Start: 2024-10-11 — End: 2024-10-11
  Filled 2024-10-11: qty 2

## 2024-10-11 MED ORDER — SIGHTPATH DOSE#1 NA HYALUR & NA CHOND-NA HYALUR IO KIT
PACK | INTRAOCULAR | Status: DC | PRN
  Administered 2024-10-11: 1 via OPHTHALMIC

## 2024-10-11 MED ORDER — MIDAZOLAM HCL 2 MG/2ML IJ SOLN
INTRAMUSCULAR | Status: AC
  Filled 2024-10-11: qty 2

## 2024-10-11 MED ORDER — LACTATED RINGERS IV SOLN
INTRAVENOUS | Status: DC
Start: 2024-10-11 — End: 2024-10-11

## 2024-10-11 MED ORDER — BRIMONIDINE TARTRATE-TIMOLOL 0.2-0.5 % OP SOLN
OPHTHALMIC | Status: DC | PRN
  Administered 2024-10-11: 1 [drp] via OPHTHALMIC

## 2024-10-11 MED ORDER — TETRACAINE HCL 0.5 % OP SOLN
OPHTHALMIC | Status: AC
  Filled 2024-10-11: qty 4

## 2024-10-11 MED ORDER — FENTANYL CITRATE (PF) 100 MCG/2ML IJ SOLN
INTRAMUSCULAR | Status: DC | PRN
  Administered 2024-10-11: 100 ug via INTRAVENOUS

## 2024-10-11 MED ORDER — PHENYLEPHRINE HCL 10 % OP SOLN
OPHTHALMIC | Status: AC
  Filled 2024-10-11: qty 5

## 2024-10-11 MED ORDER — MOXIFLOXACIN HCL 0.5 % OP SOLN
OPHTHALMIC | Status: DC | PRN
  Administered 2024-10-11: .2 mL via OPHTHALMIC

## 2024-10-11 MED ORDER — MIDAZOLAM HCL 5 MG/5ML IJ SOLN
INTRAMUSCULAR | Status: DC | PRN
  Administered 2024-10-11: 2 mg via INTRAVENOUS

## 2024-10-11 MED ORDER — SIGHTPATH DOSE#1 BSS IO SOLN
INTRAOCULAR | Status: DC | PRN
  Administered 2024-10-11: 15 mL via INTRAOCULAR

## 2024-10-11 MED ORDER — SIGHTPATH DOSE#1 BSS IO SOLN
INTRAOCULAR | Status: DC | PRN
  Administered 2024-10-11: 76 mL via OPHTHALMIC

## 2024-10-11 MED ORDER — FENTANYL CITRATE (PF) 100 MCG/2ML IJ SOLN
INTRAMUSCULAR | Status: AC
  Filled 2024-10-11: qty 2

## 2024-10-11 MED ORDER — LIDOCAINE HCL (PF) 2 % IJ SOLN
INTRAOCULAR | Status: DC | PRN
  Administered 2024-10-11: 4 mL via INTRAOCULAR

## 2024-10-11 MED ORDER — CYCLOPENTOLATE HCL 2 % OP SOLN
1.0000 [drp] | OPHTHALMIC | Status: AC
  Administered 2024-10-11 (×3): 1 [drp] via OPHTHALMIC

## 2024-10-11 SURGICAL SUPPLY — 10 items
DISSECTOR HYDRO NUCLEUS 50X22 (MISCELLANEOUS) ×1 IMPLANT
DRSG TEGADERM 2-3/8X2-3/4 SM (GAUZE/BANDAGES/DRESSINGS) ×1 IMPLANT
FEE CATARACT SUITE SIGHTPATH (MISCELLANEOUS) ×1 IMPLANT
GLOVE BIOGEL PI IND STRL 8 (GLOVE) ×1 IMPLANT
GLOVE SURG LX STRL 7.5 STRW (GLOVE) ×1 IMPLANT
GLOVE SURG SYN 6.5 PF PI BL (GLOVE) ×1 IMPLANT
LENS IOL TECNIS MONO 18.0 (Intraocular Lens) IMPLANT
NDL FILTER BLUNT 18X1 1/2 (NEEDLE) ×1 IMPLANT
NEEDLE FILTER BLUNT 18X1 1/2 (NEEDLE) ×1 IMPLANT
SYR 3ML LL SCALE MARK (SYRINGE) ×1 IMPLANT

## 2024-10-11 NOTE — H&P (Signed)
 [facility]   Primary Care Physician:  Watt Bernarda NOVAK, PA-C Ophthalmologist: Dr. Feliciano Patrick  Pre-Procedure History & Physical: HPI:  Rachel Patrick is a 48 y.o. female here for cataract surgery.   Past Medical History:  Diagnosis Date   Anxiety    Cataract 08/28/2024   Depression    Dysplasia of cervix, high grade CIN 2 2019   Dysplasia of cervix, high grade CIN 2 12/25/2019   History of mammogram 08/12/2013; 11 08-17   BIRAD I; NEG   History of Papanicolaou smear of cervix 08/26/2014   -/-   Insomnia    Migraine     Past Surgical History:  Procedure Laterality Date   INTRAUTERINE DEVICE (IUD) INSERTION  05/10/2012   LEEP  10/2017   Dr. Lake    Prior to Admission medications   Medication Sig Start Date End Date Taking? Authorizing Provider  Calcium Carbonate-Vit D-Min (CALCIUM 1200 PO) Take 1 tablet by mouth daily. Takes 750mg    Yes [provider]  Cholecalciferol (D3) 25 MCG (1000 UT) capsule Take 1,000 Units by mouth daily.   Yes [provider]  Magnesium 400 MG CAPS Take 1 capsule by mouth daily.   Yes [provider]  Multiple Vitamins-Minerals (MULTIVITAMIN WITH MINERALS) tablet Take 1 tablet by mouth daily.   Yes [provider]  albuterol  (VENTOLIN  HFA) 108 (90 Base) MCG/ACT inhaler 1-2 puffs every 4-6 hours as needed for chest tightness/wheezing. 02/08/24   [provider]  hydrOXYzine (ATARAX) 25 MG tablet Take 1 tablet (25 mg total) by mouth every 6 (six) hours as needed for anxiety. 09/03/24   Copland, Alicia B, PA-C  levonorgestrel  (MIRENA ) 20 MCG/24HR IUD 1 Intra Uterine Device (1 each total) by Intrauterine route once. 05/05/17 08/30/24  Copland, Alicia B, PA-C  Spacer/Aero-Holding Raguel FRENCH See admin instructions. 02/08/24   [provider]    Allergies as of 09/04/2024   (No Known Allergies)    Family History  Problem Relation Age of Onset   Uterine cancer Mother 96   Parkinson's  disease Father    Cancer Father 49       PROSTATE   Non-Hodgkin's lymphoma Father        Low grade   Heart attack Brother 34   Breast cancer Neg Hx     Social History   Socioeconomic History   Marital status: Married    Spouse name: Not on file   Number of children: 2   Years of education: 16   Highest education level: Not on file  Occupational History   Occupation: ACTIVITIES COORDINATOR    Comment: [employer]  Tobacco Use   Smoking status: Former    Current packs/day: 0.00    Types: Cigarettes    Quit date: 11/29/2006    Years since quitting: 17.8   Smokeless tobacco: Never   Tobacco comments:    QUIT 2008  Vaping Use   Vaping status: Every Day   Substances: Nicotine, Flavoring, Nicotine-salt  Substance and Sexual Activity   Alcohol use: Yes    Alcohol/week: 3.0 standard drinks of alcohol    Types: 3 Glasses of wine per week    Comment: weekends only   Drug use: Never   Sexual activity: Yes    Birth control/protection: I.U.D.    Comment: Mirena   Other Topics Concern   Not on file  Social History Narrative   Not on file   Social Drivers of Health   Financial Resource Strain: Not  on file  Food Insecurity: Not on file  Transportation Needs: Not on file  Physical Activity: Not on file  Stress: Not on file  Social Connections: Not on file  Intimate Partner Violence: Not on file    Review of Systems: See HPI, otherwise negative ROS  Physical Exam: Ht 5' 8 (1.727 m)   Wt 74.8 kg   BMI 25.09 kg/m  General:   Alert, cooperative in NAD Head:  Normocephalic and atraumatic. Respiratory:  Normal work of breathing. Cardiovascular:  RRR  Impression/Plan: Rachel Patrick is here for cataract surgery.  Risks, benefits, limitations, and alternatives regarding cataract surgery have been reviewed with the patient.  Questions have been answered.  All parties agreeable.   Rachel Bryan Ober, MD  10/11/2024, 7:11 AM

## 2024-10-11 NOTE — Transfer of Care (Signed)
 Immediate Anesthesia Transfer of Care Note  Patient: Rachel Patrick  Procedure(s) Performed: PHACOEMULSIFICATION, CATARACT, WITH IOL INSERTION 12.70 01:12.1 (Right: Eye)  Patient Location: PACU  Anesthesia Type: MAC  Level of Consciousness: awake, alert  and patient cooperative  Airway and Oxygen Therapy: Patient Spontanous Breathing and Patient connected to supplemental oxygen  Post-op Assessment: Post-op Vital signs reviewed, Patient's Cardiovascular Status Stable, Respiratory Function Stable, Patent Airway and No signs of Nausea or vomiting  Post-op Vital Signs: Reviewed and stable  Complications: No notable events documented.

## 2024-10-11 NOTE — Op Note (Signed)
 OPERATIVE NOTE  Rachel Patrick [id number] 10/11/2024   PREOPERATIVE DIAGNOSIS: Nuclear sclerotic cataract right eye. H25.11   POSTOPERATIVE DIAGNOSIS: Nuclear sclerotic cataract right eye. H25.11   PROCEDURE:  Phacoemulsification with posterior chamber intraocular lens placement of the right eye  Ultrasound time: Procedure(s): PHACOEMULSIFICATION, CATARACT, WITH IOL INSERTION 12.70 01:12.1 (Right)  LENS:   Implant Name Type Inv. Item Serial No. Manufacturer Lot No. LRB No. Used Action  LENS IOL TECNIS MONO 18.0 - [id number] Intraocular Lens LENS IOL TECNIS MONO 18.0 [id number] [employer]  Right 1 Implanted      SURGEON:  Feliciano HERO. Enola, MD   ANESTHESIA:  Topical with tetracaine drops, augmented with 1% preservative-free intracameral lidocaine.   COMPLICATIONS:  None.   DESCRIPTION OF PROCEDURE:  The patient was identified in the holding room and transported to the operating room and placed in the supine position under the operating microscope.  The right eye was identified as the operative eye, which was prepped and draped in the usual sterile ophthalmic fashion.   A 1 millimeter clear-corneal paracentesis was made superotemporally. Preservative-free 1% lidocaine mixed with 1:1,000 bisulfite-free aqueous solution of epinephrine was injected into the anterior chamber. The anterior chamber was then filled with Viscoat viscoelastic. A 2.4 millimeter keratome was used to make a clear-corneal incision inferotemporally. A curvilinear capsulorrhexis was made with a cystotome and capsulorrhexis forceps. Balanced salt solution was used to hydrodissect and hydrodelineate the nucleus. Phacoemulsification was then used to remove the lens nucleus and epinucleus. The remaining cortex was then removed using the irrigation and aspiration handpiece. Provisc was then placed into the capsular bag to distend it for lens placement. An +18.00 D [id number] intraocular lens was then injected into the capsular bag.  The remaining viscoelastic was aspirated.   Wounds were hydrated with balanced salt solution.  The anterior chamber was inflated to a physiologic pressure with balanced salt solution.  No wound leaks were noted. Moxifloxacin was injected intracamerally.  Timolol and Brimonidine drops were applied to the eye.  The patient was taken to the recovery room in stable condition without complications of anesthesia or surgery.  [employer] 10/11/2024, 11:05 AM

## 2024-10-11 NOTE — Anesthesia Postprocedure Evaluation (Signed)
 Anesthesia Post Note  Patient: Rachel Patrick  Procedure(s) Performed: PHACOEMULSIFICATION, CATARACT, WITH IOL INSERTION 12.70 01:12.1 (Right: Eye)  Patient location during evaluation: PACU Anesthesia Type: MAC Level of consciousness: awake and alert Pain management: pain level controlled Vital Signs Assessment: post-procedure vital signs reviewed and stable Respiratory status: spontaneous breathing, nonlabored ventilation, respiratory function stable and patient connected to nasal cannula oxygen Cardiovascular status: stable and blood pressure returned to baseline Postop Assessment: no apparent nausea or vomiting Anesthetic complications: no   No notable events documented.   Last Vitals:  Vitals:   10/11/24 1108 10/11/24 1114  BP: 120/81 (!) 118/97  Pulse: 79 74  Resp: 16 (!) 24  Temp: (!) 36.1 C (!) 36.1 C  SpO2: 98% 99%    Last Pain:  Vitals:   10/11/24 1114  TempSrc:   PainSc: 0-No pain                 Shea Swalley C Dejay Kronk

## 2024-10-12 ENCOUNTER — Encounter: Payer: Self-pay | Admitting: Ophthalmology

## 2024-11-06 DIAGNOSIS — J019 Acute sinusitis, unspecified: Secondary | ICD-10-CM | POA: Diagnosis not present
